# Patient Record
Sex: Male | Born: 1955 | Race: White | Hispanic: No | Smoking: Current every day smoker
Health system: Southern US, Community
[De-identification: ages and names within clinical notes are randomized; demographics above are authoritative.]

## PROBLEM LIST (undated history)

## (undated) DIAGNOSIS — K649 Unspecified hemorrhoids: Secondary | ICD-10-CM

## (undated) DIAGNOSIS — K635 Polyp of colon: Secondary | ICD-10-CM

## (undated) DIAGNOSIS — I209 Angina pectoris, unspecified: Secondary | ICD-10-CM

## (undated) DIAGNOSIS — L570 Actinic keratosis: Secondary | ICD-10-CM

## (undated) DIAGNOSIS — Z720 Tobacco use: Secondary | ICD-10-CM

## (undated) DIAGNOSIS — F32A Depression, unspecified: Secondary | ICD-10-CM

## (undated) DIAGNOSIS — I10 Essential (primary) hypertension: Secondary | ICD-10-CM

## (undated) DIAGNOSIS — F329 Major depressive disorder, single episode, unspecified: Secondary | ICD-10-CM

## (undated) DIAGNOSIS — E7800 Pure hypercholesterolemia, unspecified: Secondary | ICD-10-CM

## (undated) DIAGNOSIS — C801 Malignant (primary) neoplasm, unspecified: Secondary | ICD-10-CM

## (undated) DIAGNOSIS — F419 Anxiety disorder, unspecified: Secondary | ICD-10-CM

## (undated) HISTORY — PX: ANKLE ARTHROSCOPY: SUR85

## (undated) HISTORY — DX: Actinic keratosis: L57.0

## (undated) HISTORY — PX: OTHER SURGICAL HISTORY: SHX169

## (undated) HISTORY — PX: PENILE PROSTHESIS IMPLANT: SHX240

## (undated) HISTORY — PX: COLONOSCOPY: SHX174

---

## 2007-03-14 ENCOUNTER — Ambulatory Visit: Payer: Self-pay | Admitting: Urology

## 2007-11-02 DIAGNOSIS — C4491 Basal cell carcinoma of skin, unspecified: Secondary | ICD-10-CM

## 2007-11-02 HISTORY — DX: Basal cell carcinoma of skin, unspecified: C44.91

## 2008-05-30 DIAGNOSIS — C439 Malignant melanoma of skin, unspecified: Secondary | ICD-10-CM

## 2008-05-30 HISTORY — DX: Malignant melanoma of skin, unspecified: C43.9

## 2009-09-17 DIAGNOSIS — C4492 Squamous cell carcinoma of skin, unspecified: Secondary | ICD-10-CM

## 2009-09-17 DIAGNOSIS — D229 Melanocytic nevi, unspecified: Secondary | ICD-10-CM

## 2009-09-17 HISTORY — DX: Squamous cell carcinoma of skin, unspecified: C44.92

## 2009-09-17 HISTORY — DX: Melanocytic nevi, unspecified: D22.9

## 2011-10-22 ENCOUNTER — Ambulatory Visit: Payer: Self-pay | Admitting: Unknown Physician Specialty

## 2013-01-26 ENCOUNTER — Emergency Department: Payer: Self-pay | Admitting: Internal Medicine

## 2013-02-20 ENCOUNTER — Ambulatory Visit: Payer: Self-pay | Admitting: Specialist

## 2014-01-10 ENCOUNTER — Ambulatory Visit: Payer: Self-pay | Admitting: Internal Medicine

## 2014-01-11 ENCOUNTER — Ambulatory Visit: Payer: Self-pay | Admitting: Internal Medicine

## 2014-02-12 IMAGING — CR DG ANKLE COMPLETE 3+V*L*
1 series · 6 of 6 positions shown · non-contrast
Comparison: none

REASON FOR EXAM: Left ankle pain from fracture
COMMENTS:

[Series 1: x ankle obl left · 0.14mm/px · 6 of 6 slices shown]
[im 1/6]
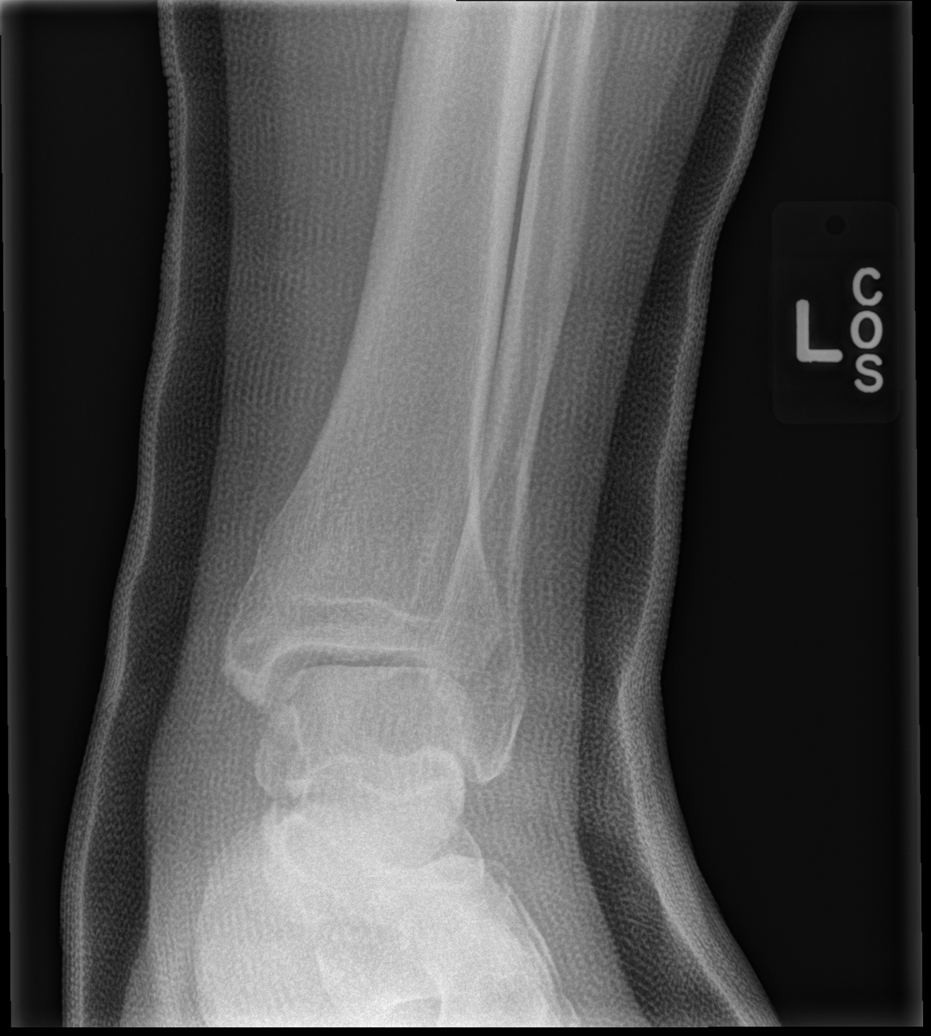
[im 2/6]
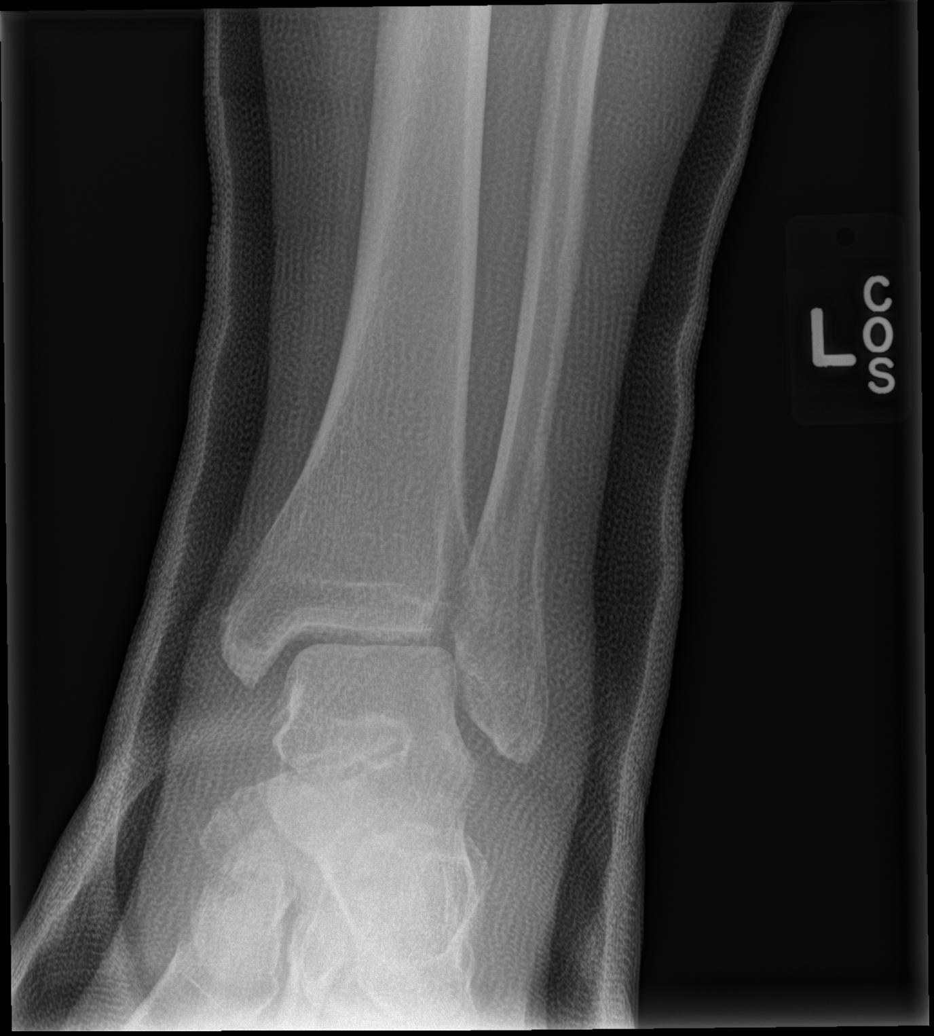
[im 3/6]
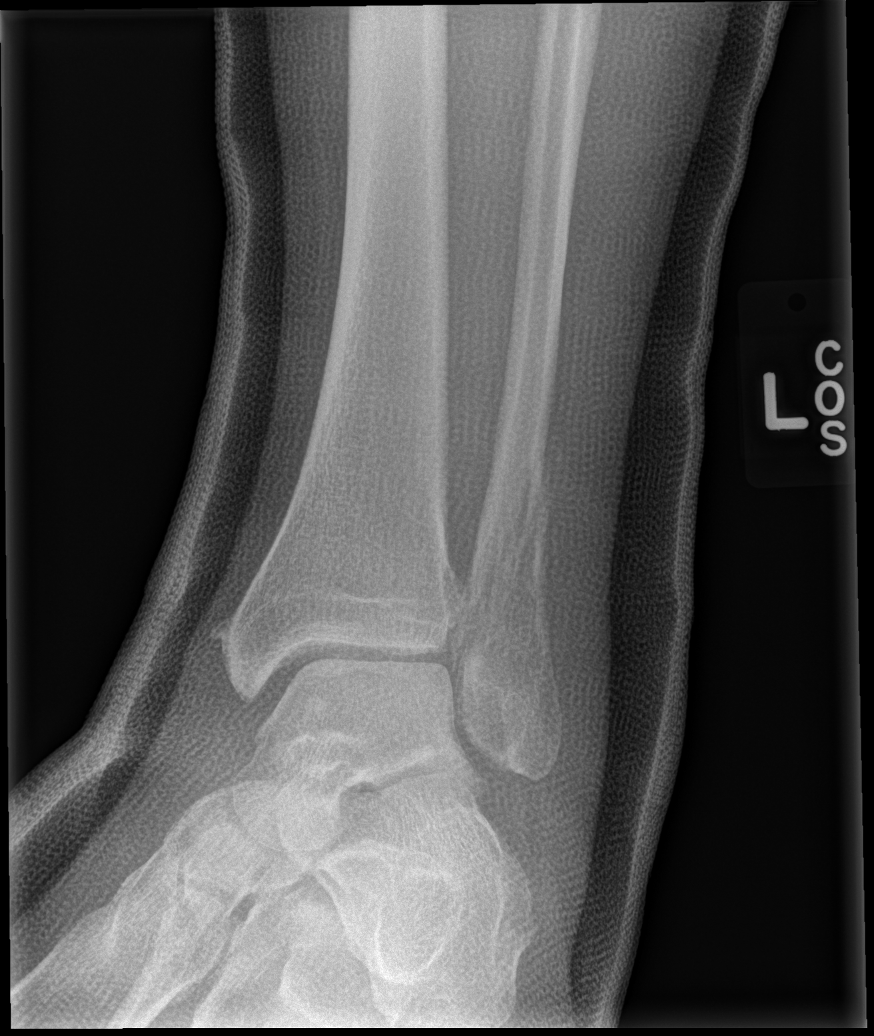
[im 4/6]
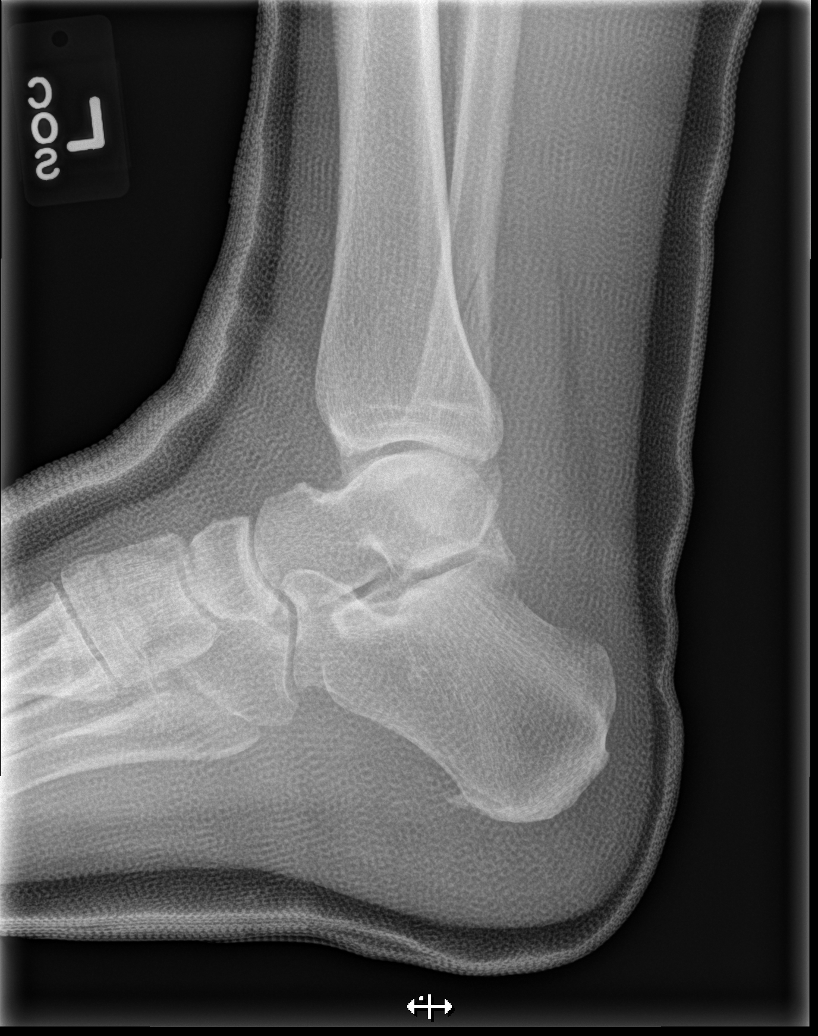
[im 5/6]
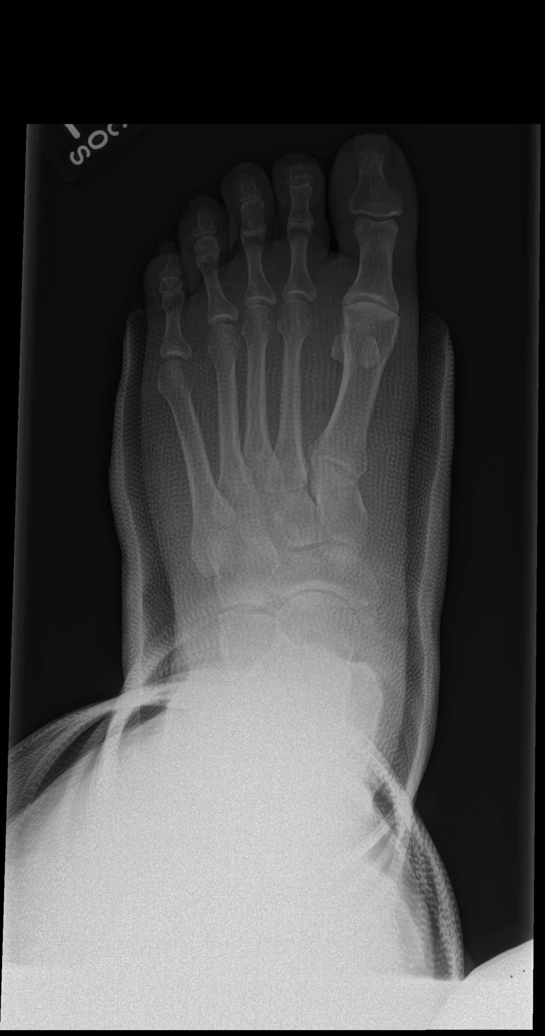
[im 6/6]
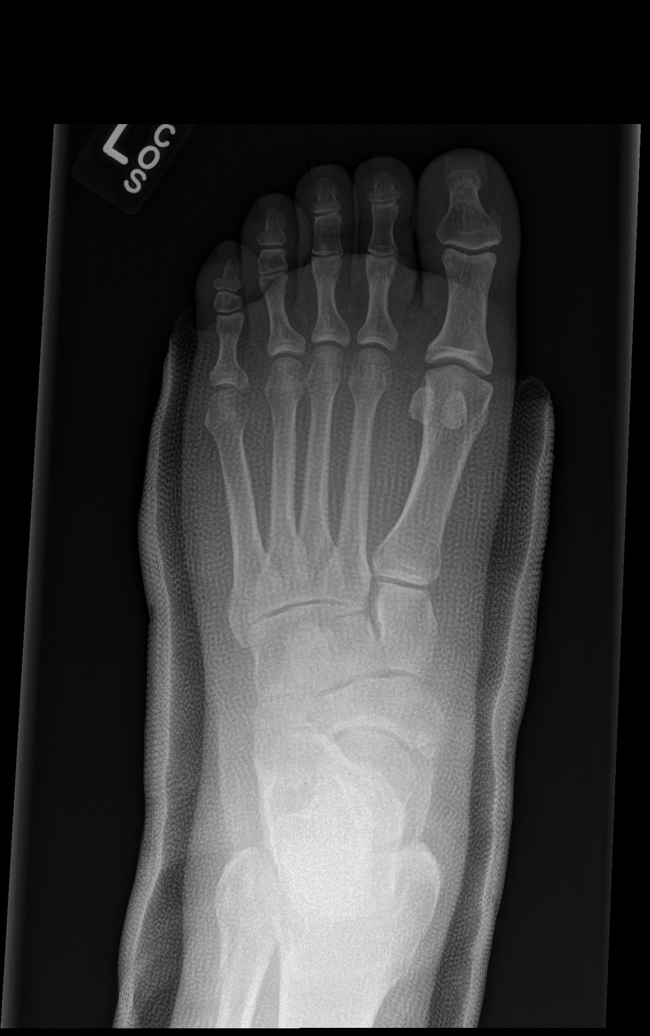

[6 of 6 positions shown; findings below may reference images not displayed]

PROCEDURE:     DXR - DXR ANKLE LEFT COMPLETE  - February 20, 2013  [DATE]

RESULT:     In plaster views of the left ankle reveal a spiral fracture of
the distal fibular metadiaphysis. Alignment is near-anatomic. There is very
mild widening of the joint mortise. There is no posterior or medial
malleolar fracture. The talar dome is grossly normal. There is a plantar
calcaneal spur.
IMPRESSION: There is evidence of ongoing healing of the distal fibular
fracture is a small amount of periosteal reaction demonstrated. The fracture
lines remain visible.

[REDACTED]

## 2015-05-02 ENCOUNTER — Emergency Department: Payer: No Typology Code available for payment source

## 2015-05-02 ENCOUNTER — Emergency Department
Admission: EM | Admit: 2015-05-02 | Discharge: 2015-05-02 | Disposition: A | Discharge status: Home / Self Care | Payer: No Typology Code available for payment source | Attending: Emergency Medicine | Admitting: Emergency Medicine

## 2015-05-02 ENCOUNTER — Encounter: Payer: Self-pay | Admitting: Emergency Medicine

## 2015-05-02 DIAGNOSIS — R0602 Shortness of breath: Secondary | ICD-10-CM | POA: Diagnosis not present

## 2015-05-02 DIAGNOSIS — R079 Chest pain, unspecified: Secondary | ICD-10-CM | POA: Diagnosis present

## 2015-05-02 DIAGNOSIS — I10 Essential (primary) hypertension: Secondary | ICD-10-CM | POA: Insufficient documentation

## 2015-05-02 HISTORY — DX: Anxiety disorder, unspecified: F41.9

## 2015-05-02 HISTORY — DX: Essential (primary) hypertension: I10

## 2015-05-02 HISTORY — DX: Pure hypercholesterolemia, unspecified: E78.00

## 2015-05-02 HISTORY — DX: Major depressive disorder, single episode, unspecified: F32.9

## 2015-05-02 HISTORY — DX: Depression, unspecified: F32.A

## 2015-05-02 LAB — CBC
HCT: 50 % (ref 40.0–52.0)
Hemoglobin: 17.2 g/dL (ref 13.0–18.0)
MCH: 32.4 pg (ref 26.0–34.0)
MCHC: 34.4 g/dL (ref 32.0–36.0)
MCV: 93.9 fL (ref 80.0–100.0)
Platelets: 316 10*3/uL (ref 150–440)
RBC: 5.32 MIL/uL (ref 4.40–5.90)
RDW: 13.7 % (ref 11.5–14.5)
WBC: 6.8 10*3/uL (ref 3.8–10.6)

## 2015-05-02 LAB — BASIC METABOLIC PANEL WITH GFR
Anion gap: 8 (ref 5–15)
BUN: 25 mg/dL — ABNORMAL HIGH (ref 6–20)
CO2: 27 mmol/L (ref 22–32)
Calcium: 9.4 mg/dL (ref 8.9–10.3)
Chloride: 101 mmol/L (ref 101–111)
Creatinine, Ser: 1.11 mg/dL (ref 0.61–1.24)
GFR calc Af Amer: >60 mL/min
GFR calc non Af Amer: >60 mL/min
Glucose, Bld: 107 mg/dL — ABNORMAL HIGH (ref 65–99)
Potassium: 3.9 mmol/L (ref 3.5–5.1)
Sodium: 136 mmol/L (ref 135–145)

## 2015-05-02 LAB — TROPONIN I: Troponin I: <0.03 ng/mL (ref <0.031)

## 2015-05-02 NOTE — ED Provider Notes (Signed)
[facility] Emergency Department Provider Note  ____________________________________________  Time seen: Approximately 12:15 PM  I have reviewed the triage vital signs and the nursing notes.   HISTORY  Chief Complaint Chest Pain    HPI Chad Terry is a 59 y.o. male with a history of hypertension and hypercholesterolemia who is presenting today with chest pain over the past week. He says that it started this past Saturday with a shooting pain that lasted a second in his left chest down his left arm. He said that afterwards for several seconds he also feels a soreness in his left chest that worsens when he twists to his right. He denies any shortness of breath that is associated with it as well as any nausea vomiting or diaphoresis. He says that he has had shortness of breath in the evenings for the past 2-1/2 years since the death of his son. He says that he takes antidepressants as well as anxiety medicines for this in the evening which relieves his shortness of breath. He has no history of cardiac disease however his mother died from heart disease in her 20s. He does smoke cigarettes. Eyes no chest pain right now. Says that he has also had 6-8 episodes of the shooting pain per day since this past Saturday.No exertional symptoms.   Past Medical History  Diagnosis Date  . Hypertension   . Hypercholesteremia   . Anxiety   . Depression     There are no active problems to display for this patient.   Past Surgical History  Procedure Laterality Date  . Penile prosthesis implant      No current outpatient prescriptions on file.  Allergies Review of patient's allergies indicates no known allergies.  No family history on file.  Social History Social History  Substance Use Topics  . Smoking status: Not on file  . Smokeless tobacco: Not on file  . Alcohol Use: Not on file    Review of Systems Constitutional: No fever/chills Eyes: No visual  changes. ENT: No sore throat. Cardiovascular: As above  Respiratory: As above  Gastrointestinal: No abdominal pain.  No nausea, no vomiting.  No diarrhea.  No constipation. Genitourinary: Negative for dysuria. Musculoskeletal: Negative for back pain. Skin: Negative for rash. Neurological: Negative for headaches, focal weakness or numbness.  10-point ROS otherwise negative.  ____________________________________________   PHYSICAL EXAM:  VITAL SIGNS: ED Triage Vitals  Enc Vitals Group     BP 05/02/15 0923 156/93 mmHg     Pulse Rate 05/02/15 0923 89     Resp 05/02/15 0923 18     Temp 05/02/15 0923 98.2 F (36.8 C)     Temp Source 05/02/15 0923 Oral     SpO2 05/02/15 0923 95 %     Weight 05/02/15 0923 215 lb (97.523 kg)     Height 05/02/15 0923 5\' 9"  (1.753 m)     Head Cir --      Peak Flow --      Pain Score 05/02/15 0923 0     Pain Loc --      Pain Edu? --      Excl. in [place]? --     Constitutional: Alert and oriented. Well appearing and in no acute distress. Eyes: Conjunctivae are normal. PERRL. EOMI. Head: Atraumatic. Nose: No congestion/rhinnorhea. Mouth/Throat: Mucous membranes are moist.  Oropharynx non-erythematous. Neck: No stridor.   Cardiovascular: Normal rate, regular rhythm. Grossly normal heart sounds.  Good peripheral circulation. No reproducibility with palpation to left  chest. Bilateral and equal radial pulses present. Respiratory: Normal respiratory effort.  No retractions. Lungs CTAB. Gastrointestinal: Soft and nontender. No distention. No abdominal bruits. No CVA tenderness. Musculoskeletal: No lower extremity tenderness nor edema.  No joint effusions. Neurologic:  Normal speech and language. No gross focal neurologic deficits are appreciated. No gait instability. Skin:  Skin is warm, dry and intact. No rash noted. Psychiatric: Mood and affect are normal. Speech and behavior are normal.  ____________________________________________   LABS (all labs  ordered are listed, but only abnormal results are displayed)  Labs Reviewed  BASIC METABOLIC PANEL - Abnormal; Notable for the following:    Glucose, Bld 107 (*)    BUN 25 (*)    All other components within normal limits  CBC  TROPONIN I   ____________________________________________  EKG  ED ECG REPORT I, Doran Stabler, the attending physician, personally viewed and interpreted this ECG.   Date: 05/02/2015  EKG Time: 936  Rate: 86  Rhythm: normal sinus rhythm  Axis: Normal axis  Intervals:none  ST&T Change: Nonspecific ST and T abnormality but with poor baseline. We will repeat EKG.  ED ECG REPORT I, Doran Stabler, the attending physician, personally viewed and interpreted this ECG.   Date: 05/02/2015  EKG Time: 1238  Rate: 63  Rhythm: normal sinus rhythm  Axis: Normal axis  Intervals:none  ST&T Change: Biphasic T waves in V2 and V3. No ST elevations or depressions.   ____________________________________________  RADIOLOGY  No active cardiopulmonary disease on the chest x-ray. ____________________________________________   PROCEDURES    ____________________________________________   INITIAL IMPRESSION / ASSESSMENT AND PLAN / ED COURSE  Pertinent labs & imaging results that were available during my care of the patient were reviewed by me and considered in my medical decision making (see chart for details).  Patient with pain which is atypical for cardiac chest pain. Shortness of breath that she has been ongoing over the past 2-1/2 years. Discussed the case with Dr. Saralyn Pilar of cardiology who agrees to see the patient in the office. Because the symptoms have been ongoing for a week and the patient has only nonspecific T-wave abnormality and his EKG with a normal troponin and no chest pain at this time I believe he will be safe for follow-up in the cardiology office. Dr. Saralyn Pilar recommends that the patient call the office to schedule urgent  follow-up care. A splint is to the patient who understands and is went to comply with this plan. Will be discharged home. Patient takes a baby aspirin at home and will continue to do so.  ____________________________________________   FINAL CLINICAL IMPRESSION(S) / ED DIAGNOSES  Chest pain.    Orbie Pyo, MD 05/02/15 [phone]

## 2015-05-02 NOTE — ED Notes (Addendum)
Pt c/o pain to left chest, intermittently; pt can point to one particular spot above left nipple where the pain occurs-6-8 times a day; pt says this past weekend he was laying in bed when he had a sharp pain go down his left arm and left leg that made both jump; pt currently denies any pain; pt says only short of breath with pain at night when going to bed; denies N/V; denies diaphoresis; pt admits he's here "preventatively"; [place] and he'd like to get checked out before going out of town

## 2015-05-02 NOTE — ED Notes (Signed)
Nurse Dorthula Rue called lab to discuss why Troponin had not been resulted, lab states they are running it and it will result soon. Patricie Geeslin, first nurse notified, Marya Amsler, charge nurse notified.

## 2015-05-02 NOTE — ED Notes (Signed)
Patient standing walking around bed and hall.

## 2015-05-02 NOTE — ED Notes (Signed)
Pt very inappropriate in triage with staff; talking about his penile pump and cursing; says he doesn't drink or smoke, this is just her normal personality

## 2015-05-02 NOTE — ED Notes (Signed)
Pt states pain started last Saturday that was pinpoint sharp pain over left breast with tenderness.  States on Saturday pain quickly radiated to left arm and made it jump, a second time went to left leg and made it jump.  Patient reports having 6-8 episodes of quick sharp pain that dissipates right away with no other symptoms.  Denies n/v/d, denies other symptoms.  Pt is A&Ox4, speaking in complete and coherent sentences and in NAD at this time.

## 2015-05-23 ENCOUNTER — Ambulatory Visit
Admission: RE | Admit: 2015-05-23 | Discharge: 2015-05-24 | Disposition: A | Discharge status: Home / Self Care | Payer: No Typology Code available for payment source | Source: Ambulatory Visit | Attending: Cardiology | Admitting: Cardiology

## 2015-05-23 ENCOUNTER — Encounter
Admission: RE | Disposition: A | Discharge status: Home / Self Care | Payer: Self-pay | Source: Ambulatory Visit | Attending: Cardiology

## 2015-05-23 ENCOUNTER — Encounter: Payer: Self-pay | Admitting: *Deleted

## 2015-05-23 DIAGNOSIS — F329 Major depressive disorder, single episode, unspecified: Secondary | ICD-10-CM | POA: Insufficient documentation

## 2015-05-23 DIAGNOSIS — Z8249 Family history of ischemic heart disease and other diseases of the circulatory system: Secondary | ICD-10-CM | POA: Diagnosis not present

## 2015-05-23 DIAGNOSIS — I2510 Atherosclerotic heart disease of native coronary artery without angina pectoris: Secondary | ICD-10-CM | POA: Diagnosis not present

## 2015-05-23 DIAGNOSIS — Z79899 Other long term (current) drug therapy: Secondary | ICD-10-CM | POA: Insufficient documentation

## 2015-05-23 DIAGNOSIS — I259 Chronic ischemic heart disease, unspecified: Secondary | ICD-10-CM | POA: Insufficient documentation

## 2015-05-23 DIAGNOSIS — F17200 Nicotine dependence, unspecified, uncomplicated: Secondary | ICD-10-CM | POA: Diagnosis not present

## 2015-05-23 DIAGNOSIS — F419 Anxiety disorder, unspecified: Secondary | ICD-10-CM | POA: Diagnosis not present

## 2015-05-23 DIAGNOSIS — Z7982 Long term (current) use of aspirin: Secondary | ICD-10-CM | POA: Insufficient documentation

## 2015-05-23 DIAGNOSIS — I10 Essential (primary) hypertension: Secondary | ICD-10-CM | POA: Insufficient documentation

## 2015-05-23 DIAGNOSIS — Z86010 Personal history of colonic polyps: Secondary | ICD-10-CM | POA: Insufficient documentation

## 2015-05-23 DIAGNOSIS — Z9889 Other specified postprocedural states: Secondary | ICD-10-CM | POA: Insufficient documentation

## 2015-05-23 DIAGNOSIS — E785 Hyperlipidemia, unspecified: Secondary | ICD-10-CM | POA: Diagnosis not present

## 2015-05-23 DIAGNOSIS — J449 Chronic obstructive pulmonary disease, unspecified: Secondary | ICD-10-CM | POA: Diagnosis not present

## 2015-05-23 DIAGNOSIS — I25110 Atherosclerotic heart disease of native coronary artery with unstable angina pectoris: Secondary | ICD-10-CM | POA: Diagnosis present

## 2015-05-23 DIAGNOSIS — Z8042 Family history of malignant neoplasm of prostate: Secondary | ICD-10-CM | POA: Diagnosis not present

## 2015-05-23 DIAGNOSIS — R079 Chest pain, unspecified: Secondary | ICD-10-CM | POA: Diagnosis present

## 2015-05-23 HISTORY — PX: CARDIAC CATHETERIZATION: SHX172

## 2015-05-23 HISTORY — DX: Polyp of colon: K63.5

## 2015-05-23 SURGERY — LEFT HEART CATH AND CORONARY ANGIOGRAPHY
Anesthesia: Moderate Sedation

## 2015-05-23 MED ORDER — BIVALIRUDIN BOLUS VIA INFUSION - CUPID
INTRAVENOUS | Status: DC | PRN
  Administered 2015-05-23: 72.15 mg via INTRAVENOUS

## 2015-05-23 MED ORDER — ALPRAZOLAM ER 1 MG PO TB24
2.0000 mg | ORAL_TABLET | Freq: Three times a day (TID) | ORAL | Status: DC
  Administered 2015-05-23: 2 mg via ORAL
  Filled 2015-05-23 (×2): qty 2

## 2015-05-23 MED ORDER — TRIAMTERENE-HCTZ 37.5-25 MG PO TABS
1.0000 | ORAL_TABLET | Freq: Every day | ORAL | Status: DC
  Filled 2015-05-23 (×2): qty 1

## 2015-05-23 MED ORDER — BIVALIRUDIN 250 MG IV SOLR
INTRAVENOUS | Status: AC
  Filled 2015-05-23: qty 250

## 2015-05-23 MED ORDER — FENTANYL CITRATE (PF) 100 MCG/2ML IJ SOLN
INTRAMUSCULAR | Status: AC
  Filled 2015-05-23: qty 2

## 2015-05-23 MED ORDER — CLOPIDOGREL BISULFATE 75 MG PO TABS
ORAL_TABLET | ORAL | Status: AC
  Filled 2015-05-23: qty 8

## 2015-05-23 MED ORDER — SODIUM CHLORIDE 0.9 % IJ SOLN
3.0000 mL | Freq: Two times a day (BID) | INTRAMUSCULAR | Status: DC
  Administered 2015-05-23: 3 mL via INTRAVENOUS

## 2015-05-23 MED ORDER — NITROGLYCERIN 5 MG/ML IV SOLN
INTRAVENOUS | Status: AC
  Filled 2015-05-23: qty 10

## 2015-05-23 MED ORDER — ASPIRIN 81 MG PO CHEW
CHEWABLE_TABLET | ORAL | Status: DC | PRN
  Administered 2015-05-23: 243 mg via ORAL

## 2015-05-23 MED ORDER — NITROGLYCERIN 1 MG/10 ML FOR IR/CATH LAB
INTRA_ARTERIAL | Status: DC | PRN
  Administered 2015-05-23 (×6): 200 ug via INTRACORONARY

## 2015-05-23 MED ORDER — ACETAMINOPHEN 325 MG PO TABS: 650.0000 mg | ORAL_TABLET | ORAL | Status: DC | PRN

## 2015-05-23 MED ORDER — ASPIRIN 81 MG PO CHEW
CHEWABLE_TABLET | ORAL | Status: AC
  Filled 2015-05-23: qty 3

## 2015-05-23 MED ORDER — SODIUM CHLORIDE 0.9 % IV SOLN
250.0000 mg | INTRAVENOUS | Status: DC | PRN
  Administered 2015-05-23: 1.75 mg/kg/h via INTRAVENOUS

## 2015-05-23 MED ORDER — SIMVASTATIN 40 MG PO TABS: 40.0000 mg | ORAL_TABLET | Freq: Every day | ORAL | Status: DC

## 2015-05-23 MED ORDER — METOPROLOL SUCCINATE ER 50 MG PO TB24: 50.0000 mg | ORAL_TABLET | Freq: Every day | ORAL | Status: DC

## 2015-05-23 MED ORDER — DIMENHYDRINATE 50 MG PO TABS
200.0000 mg | ORAL_TABLET | Freq: Every evening | ORAL | Status: DC | PRN
  Administered 2015-05-23: 200 mg via ORAL
  Filled 2015-05-23 (×3): qty 4

## 2015-05-23 MED ORDER — ONDANSETRON HCL 4 MG/2ML IJ SOLN: 4.0000 mg | Freq: Four times a day (QID) | INTRAMUSCULAR | Status: DC | PRN

## 2015-05-23 MED ORDER — FENTANYL CITRATE (PF) 100 MCG/2ML IJ SOLN
INTRAMUSCULAR | Status: DC | PRN
  Administered 2015-05-23: 50 ug via INTRAVENOUS

## 2015-05-23 MED ORDER — CLOPIDOGREL BISULFATE 75 MG PO TABS
ORAL_TABLET | ORAL | Status: DC | PRN
  Administered 2015-05-23: 600 mg via ORAL

## 2015-05-23 MED ORDER — MIDAZOLAM HCL 2 MG/2ML IJ SOLN
INTRAMUSCULAR | Status: DC | PRN
  Administered 2015-05-23 (×2): 1 mg via INTRAVENOUS

## 2015-05-23 MED ORDER — MIDAZOLAM HCL 2 MG/2ML IJ SOLN
INTRAMUSCULAR | Status: AC
  Filled 2015-05-23: qty 2

## 2015-05-23 MED ORDER — SERTRALINE HCL 50 MG PO TABS
150.0000 mg | ORAL_TABLET | Freq: Every day | ORAL | Status: DC
  Administered 2015-05-23: 150 mg via ORAL
  Filled 2015-05-23 (×3): qty 1

## 2015-05-23 MED ORDER — CLOPIDOGREL BISULFATE 75 MG PO TABS
75.0000 mg | ORAL_TABLET | Freq: Every day | ORAL | Status: DC
  Administered 2015-05-24: 75 mg via ORAL
  Filled 2015-05-23: qty 1

## 2015-05-23 MED ORDER — IOHEXOL 300 MG/ML  SOLN
INTRAMUSCULAR | Status: DC | PRN
  Administered 2015-05-23: 375 mL via INTRA_ARTERIAL

## 2015-05-23 MED ORDER — ASPIRIN 81 MG PO CHEW: 81.0000 mg | CHEWABLE_TABLET | Freq: Every day | ORAL | Status: DC

## 2015-05-23 MED ORDER — SODIUM CHLORIDE 0.9 % IV SOLN
INTRAVENOUS | Status: DC
  Administered 2015-05-23: 12:00:00 via INTRAVENOUS

## 2015-05-23 SURGICAL SUPPLY — 20 items
BALLN TREK RX 2.25X20 (BALLOONS) ×2
BALLN TREK RX 2.5X15 (BALLOONS) ×2
BALLOON TREK RX 2.25X20 (BALLOONS) ×1 IMPLANT
BALLOON TREK RX 2.5X15 (BALLOONS) ×1 IMPLANT
CATH INFINITI 5FR ANG PIGTAIL (CATHETERS) ×2 IMPLANT
CATH INFINITI 5FR JL4 (CATHETERS) ×2 IMPLANT
CATH INFINITI JR4 5F (CATHETERS) ×2 IMPLANT
CATH VISTA GUIDE 6FR JR4 SH (CATHETERS) ×2 IMPLANT
CATH VISTA GUIDE 6FR XB3.5 (CATHETERS) ×2 IMPLANT
DEVICE CLOSURE MYNXGRIP 6/7F (Vascular Products) ×2 IMPLANT
DEVICE INFLAT 30 PLUS (MISCELLANEOUS) ×2 IMPLANT
KIT MANI 3VAL PERCEP (MISCELLANEOUS) ×2 IMPLANT
NEEDLE PERC 18GX7CM (NEEDLE) ×2 IMPLANT
PACK CARDIAC CATH (CUSTOM PROCEDURE TRAY) ×2 IMPLANT
SHEATH AVANTI 5FR X 11CM (SHEATH) ×2 IMPLANT
SHEATH PINNACLE 6F 10CM (SHEATH) ×2 IMPLANT
STENT XIENCE ALPINE RX 2.25X23 (Permanent Stent) ×2 IMPLANT
STENT XIENCE ALPINE RX 2.75X15 (Permanent Stent) ×2 IMPLANT
WIRE ASAHI PROWATER 180CM (WIRE) ×2 IMPLANT
WIRE EMERALD 3MM-J .035X150CM (WIRE) ×2 IMPLANT

## 2015-05-23 NOTE — Progress Notes (Signed)
Stat ekg ordered, cardiopulmonary/Stacy to do ekg as pt in 249

## 2015-05-23 NOTE — Progress Notes (Signed)
Pt doing well post pci procedure per Dr Josefa Half, with vss. No bleeding nor hematoma at right groin site. Eating supper, taking pos; without difficulty, angiomax gtt finished teaching done and stent card given to sister, with questions answered, pt already has return appt pre procedure, told to keep per Dr Josefa Half. Report called to Tammy RN on telemetry. sr per monitor with stable bp/map. Offers no complaints at this time.

## 2015-05-23 NOTE — Progress Notes (Signed)
Pt wants dimenhydrinate for hour of sleep and Pt also needs a diet order. MD notified. Orders received. Will continue to assess.

## 2015-05-24 ENCOUNTER — Encounter: Payer: Self-pay | Admitting: Cardiology

## 2015-05-24 DIAGNOSIS — I2510 Atherosclerotic heart disease of native coronary artery without angina pectoris: Secondary | ICD-10-CM | POA: Diagnosis not present

## 2015-05-24 LAB — CBC
HEMATOCRIT: 43.4 % (ref 40.0–52.0)
Hemoglobin: 14.9 g/dL (ref 13.0–18.0)
MCH: 32.3 pg (ref 26.0–34.0)
MCHC: 34.4 g/dL (ref 32.0–36.0)
MCV: 93.7 fL (ref 80.0–100.0)
PLATELETS: 253 10*3/uL (ref 150–440)
RBC: 4.63 MIL/uL (ref 4.40–5.90)
RDW: 13.9 % (ref 11.5–14.5)
WBC: 7.4 10*3/uL (ref 3.8–10.6)

## 2015-05-24 LAB — BASIC METABOLIC PANEL
Anion gap: 7 (ref 5–15)
BUN: 18 mg/dL (ref 6–20)
CO2: 27 mmol/L (ref 22–32)
CREATININE: 0.98 mg/dL (ref 0.61–1.24)
Calcium: 9.1 mg/dL (ref 8.9–10.3)
Chloride: 106 mmol/L (ref 101–111)
GFR calc Af Amer: >60 mL/min (ref >60)
GLUCOSE: 100 mg/dL — AB (ref 65–99)
POTASSIUM: 3.8 mmol/L (ref 3.5–5.1)
SODIUM: 140 mmol/L (ref 135–145)

## 2015-05-24 MED ORDER — CLOPIDOGREL BISULFATE 75 MG PO TABS: 75.0000 mg | ORAL_TABLET | Freq: Every day | ORAL | Status: DC

## 2015-05-24 NOTE — Progress Notes (Signed)
EKG done

## 2015-05-24 NOTE — Discharge Summary (Signed)
Physician Discharge Summary  Patient ID: Chad Terry MRN: [id number] DOB/AGE: Feb 02, 1956 59 y.o.  Admit date: 05/23/2015 Discharge date: 05/24/2015  Primary Discharge Diagnosis unstable angina Secondary Discharge Diagnosis coronary artery disease  Significant Diagnostic Studies: Cardiac catheterization  Consults: None  Hospital Course: The patient underwent elective cardiac catheterization on 05/23/15 which revealed two-vessel coronary artery disease with high-grade 95% stenosis mid LAD and distal RCA. The patient underwent PCI receiving 0.75 x 15 mm XienceAlpine stent in the mid LAD, and 2.25 x 23 mm Xience Alpine stent in the distal RCA with an excellent angiographic result. Patient had an uncomplicated hospital course. On the morning of 05/24/15, the patient was ambulating without difficulty was discharged home. He is scheduled to see me in follow-up in one week   Discharge Exam: Blood pressure 131/79, pulse 65, temperature 98.6 F (37 C), temperature source Oral, resp. rate 16, height 5\' 9"  (1.753 m), weight 96.163 kg (212 lb), SpO2 96 %.  General appearance: alert Labs:   Lab Results  Component Value Date   WBC 7.4 05/24/2015   HGB 14.9 05/24/2015   HCT 43.4 05/24/2015   MCV 93.7 05/24/2015   PLT 253 05/24/2015    Recent Labs Lab 05/24/15 0431  NA 140  K 3.8  CL 106  CO2 27  BUN 18  CREATININE 0.98  CALCIUM 9.1  GLUCOSE 100*      Radiology:  EKG: Normal sinus rhythm  FOLLOW UP PLANS AND APPOINTMENTS    Medication List    TAKE these medications        ALPRAZolam 2 MG 24 hr tablet  Commonly known as:  XANAX XR  Take 2 mg by mouth 3 (three) times daily. Takes at bedtime one pill, other only if needed     aspirin 81 MG tablet  Take 81 mg by mouth daily.     clopidogrel 75 MG tablet  Commonly known as:  PLAVIX  Take 1 tablet (75 mg total) by mouth daily with breakfast.     dimenhyDRINATE 50 MG tablet  Commonly known as:  DRAMAMINE  Take  50 mg by mouth daily at 10 pm. 4 tablet at bedtime     metoprolol succinate 50 MG 24 hr tablet  Commonly known as:  TOPROL-XL  Take 50 mg by mouth daily. Take with or immediately following a meal.     multivitamin capsule  Take 1 capsule by mouth daily.     OMEGA-3 FISH OIL PO  Take 360-1,200 mg by mouth every morning. Omega 3-fish oil 360-1200mg  daily not in database     Potassium Gluconate 550 (90 K) MG Tabs  Take 550 mg by mouth every morning. Written as 550mg (90mg )?tab     sertraline 100 MG tablet  Commonly known as:  ZOLOFT  Take 150 mg by mouth at bedtime.     simvastatin 40 MG tablet  Commonly known as:  ZOCOR  Take 40 mg by mouth daily.     triamterene-hydrochlorothiazide 37.5-25 MG capsule  Commonly known as:  DYAZIDE  Take 1 capsule by mouth daily.           Follow-up Information    Follow up with Lexis Potenza, MD In 1 week.   Specialty:  Cardiology   Contact information:   [facility] [place] [address] [phone]       BRING ALL MEDICATIONS WITH YOU TO FOLLOW UP APPOINTMENTS  Time spent with patient to include physician time: 25 min Signed:  Neriyah Cercone MD, PhD, [facility] 05/24/2015, 8:01 AM

## 2017-01-18 ENCOUNTER — Encounter: Payer: Self-pay | Admitting: Anesthesiology

## 2017-01-18 ENCOUNTER — Ambulatory Visit
Admission: RE | Admit: 2017-01-18 | Discharge: 2017-01-18 | Disposition: A | Discharge status: Home / Self Care | Payer: BLUE CROSS/BLUE SHIELD | Source: Ambulatory Visit | Attending: Unknown Physician Specialty | Admitting: Unknown Physician Specialty

## 2017-01-18 ENCOUNTER — Ambulatory Visit: Payer: BLUE CROSS/BLUE SHIELD | Admitting: Anesthesiology

## 2017-01-18 ENCOUNTER — Encounter
Admission: RE | Disposition: A | Discharge status: Home / Self Care | Payer: Self-pay | Source: Ambulatory Visit | Attending: Unknown Physician Specialty

## 2017-01-18 DIAGNOSIS — Z539 Procedure and treatment not carried out, unspecified reason: Secondary | ICD-10-CM | POA: Diagnosis not present

## 2017-01-18 SURGERY — COLONOSCOPY WITH PROPOFOL
Anesthesia: General

## 2017-01-18 MED ORDER — PROPOFOL 500 MG/50ML IV EMUL
INTRAVENOUS | Status: AC
  Filled 2017-01-18: qty 50

## 2017-01-18 MED ORDER — SODIUM CHLORIDE 0.9 % IV SOLN: INTRAVENOUS | Status: DC

## 2017-01-18 MED ORDER — MIDAZOLAM HCL 2 MG/2ML IJ SOLN
INTRAMUSCULAR | Status: AC
  Filled 2017-01-18: qty 2

## 2017-01-18 MED ORDER — FENTANYL CITRATE (PF) 100 MCG/2ML IJ SOLN
INTRAMUSCULAR | Status: AC
  Filled 2017-01-18: qty 2

## 2017-01-18 MED ORDER — PIPERACILLIN-TAZOBACTAM 3.375 G IVPB 30 MIN
3.3750 g | Freq: Once | INTRAVENOUS | Status: DC
  Filled 2017-01-18: qty 50

## 2017-01-18 NOTE — Anesthesia Preprocedure Evaluation (Deleted)
Anesthesia Evaluation  Patient identified by MRN, date of birth, ID band Patient awake    Reviewed: Allergy & Precautions, NPO status , Patient's Chart, lab work & pertinent test results, reviewed documented beta blocker date and time   Airway Mallampati: II  TM Distance: >3 FB     Dental   Pulmonary Current Smoker,    Pulmonary exam normal        Cardiovascular hypertension, Pt. on medications + CAD  Normal cardiovascular exam     Neuro/Psych PSYCHIATRIC DISORDERS Anxiety Depression    GI/Hepatic negative GI ROS, Neg liver ROS,   Endo/Other  negative endocrine ROS  Renal/GU negative Renal ROS  negative genitourinary   Musculoskeletal negative musculoskeletal ROS (+)   Abdominal Normal abdominal exam  (+)   Peds negative pediatric ROS (+)  Hematology negative hematology ROS (+)   Anesthesia Other Findings Past Medical History: No date: Anxiety No date: Colon polyp No date: Depression No date: Hypercholesteremia No date: Hypertension  Hx of coronary stents  Reproductive/Obstetrics                            Anesthesia Physical Anesthesia Plan  ASA: III  Anesthesia Plan: General   Post-op Pain Management:    Induction: Intravenous  PONV Risk Score and Plan:   Airway Management Planned: Nasal Cannula  Additional Equipment:   Intra-op Plan:   Post-operative Plan:   Informed Consent: I have reviewed the patients History and Physical, chart, labs and discussed the procedure including the risks, benefits and alternatives for the proposed anesthesia with the patient or authorized representative who has indicated his/her understanding and acceptance.   Dental advisory given  Plan Discussed with: CRNA and Surgeon  Anesthesia Plan Comments:         Anesthesia Quick Evaluation

## 2017-04-05 ENCOUNTER — Ambulatory Visit: Payer: BLUE CROSS/BLUE SHIELD | Admitting: Anesthesiology

## 2017-04-05 ENCOUNTER — Ambulatory Visit
Admission: RE | Admit: 2017-04-05 | Discharge: 2017-04-05 | Disposition: A | Discharge status: Home / Self Care | Payer: BLUE CROSS/BLUE SHIELD | Source: Ambulatory Visit | Attending: Unknown Physician Specialty | Admitting: Unknown Physician Specialty

## 2017-04-05 ENCOUNTER — Encounter
Admission: RE | Disposition: A | Discharge status: Home / Self Care | Payer: Self-pay | Source: Ambulatory Visit | Attending: Unknown Physician Specialty

## 2017-04-05 DIAGNOSIS — F329 Major depressive disorder, single episode, unspecified: Secondary | ICD-10-CM | POA: Diagnosis not present

## 2017-04-05 DIAGNOSIS — Z7902 Long term (current) use of antithrombotics/antiplatelets: Secondary | ICD-10-CM | POA: Insufficient documentation

## 2017-04-05 DIAGNOSIS — F419 Anxiety disorder, unspecified: Secondary | ICD-10-CM | POA: Diagnosis not present

## 2017-04-05 DIAGNOSIS — Z955 Presence of coronary angioplasty implant and graft: Secondary | ICD-10-CM | POA: Insufficient documentation

## 2017-04-05 DIAGNOSIS — F17290 Nicotine dependence, other tobacco product, uncomplicated: Secondary | ICD-10-CM | POA: Diagnosis not present

## 2017-04-05 DIAGNOSIS — Z85820 Personal history of malignant melanoma of skin: Secondary | ICD-10-CM | POA: Insufficient documentation

## 2017-04-05 DIAGNOSIS — I2510 Atherosclerotic heart disease of native coronary artery without angina pectoris: Secondary | ICD-10-CM | POA: Insufficient documentation

## 2017-04-05 DIAGNOSIS — Z8249 Family history of ischemic heart disease and other diseases of the circulatory system: Secondary | ICD-10-CM | POA: Diagnosis not present

## 2017-04-05 DIAGNOSIS — Z8371 Family history of colonic polyps: Secondary | ICD-10-CM | POA: Insufficient documentation

## 2017-04-05 DIAGNOSIS — D127 Benign neoplasm of rectosigmoid junction: Secondary | ICD-10-CM | POA: Diagnosis not present

## 2017-04-05 DIAGNOSIS — Z7982 Long term (current) use of aspirin: Secondary | ICD-10-CM | POA: Diagnosis not present

## 2017-04-05 DIAGNOSIS — E7800 Pure hypercholesterolemia, unspecified: Secondary | ICD-10-CM | POA: Insufficient documentation

## 2017-04-05 DIAGNOSIS — Z1211 Encounter for screening for malignant neoplasm of colon: Secondary | ICD-10-CM | POA: Diagnosis present

## 2017-04-05 DIAGNOSIS — D125 Benign neoplasm of sigmoid colon: Secondary | ICD-10-CM | POA: Diagnosis not present

## 2017-04-05 DIAGNOSIS — I10 Essential (primary) hypertension: Secondary | ICD-10-CM | POA: Insufficient documentation

## 2017-04-05 DIAGNOSIS — Z79899 Other long term (current) drug therapy: Secondary | ICD-10-CM | POA: Insufficient documentation

## 2017-04-05 DIAGNOSIS — K635 Polyp of colon: Secondary | ICD-10-CM | POA: Insufficient documentation

## 2017-04-05 HISTORY — DX: Angina pectoris, unspecified: I20.9

## 2017-04-05 HISTORY — DX: Malignant (primary) neoplasm, unspecified: C80.1

## 2017-04-05 HISTORY — PX: COLONOSCOPY WITH PROPOFOL: SHX5780

## 2017-04-05 HISTORY — DX: Tobacco use: Z72.0

## 2017-04-05 HISTORY — DX: Unspecified hemorrhoids: K64.9

## 2017-04-05 SURGERY — COLONOSCOPY WITH PROPOFOL
Anesthesia: General

## 2017-04-05 MED ORDER — PROPOFOL 500 MG/50ML IV EMUL
INTRAVENOUS | Status: DC | PRN
  Administered 2017-04-05: 120 ug/kg/min via INTRAVENOUS

## 2017-04-05 MED ORDER — PROPOFOL 500 MG/50ML IV EMUL
INTRAVENOUS | Status: AC
  Filled 2017-04-05: qty 50

## 2017-04-05 MED ORDER — MIDAZOLAM HCL 2 MG/2ML IJ SOLN
INTRAMUSCULAR | Status: DC | PRN
  Administered 2017-04-05: 2 mg via INTRAVENOUS

## 2017-04-05 MED ORDER — PIPERACILLIN-TAZOBACTAM 3.375 G IVPB 30 MIN
3.3750 g | Freq: Once | INTRAVENOUS | Status: AC
  Administered 2017-04-05: 3.375 g via INTRAVENOUS
  Filled 2017-04-05: qty 50

## 2017-04-05 MED ORDER — EPHEDRINE SULFATE 50 MG/ML IJ SOLN
INTRAMUSCULAR | Status: AC
  Filled 2017-04-05: qty 1

## 2017-04-05 MED ORDER — MIDAZOLAM HCL 2 MG/2ML IJ SOLN
INTRAMUSCULAR | Status: AC
  Filled 2017-04-05: qty 2

## 2017-04-05 MED ORDER — FENTANYL CITRATE (PF) 100 MCG/2ML IJ SOLN
INTRAMUSCULAR | Status: DC | PRN
  Administered 2017-04-05 (×2): 50 ug via INTRAVENOUS

## 2017-04-05 MED ORDER — FENTANYL CITRATE (PF) 100 MCG/2ML IJ SOLN
INTRAMUSCULAR | Status: AC
  Filled 2017-04-05: qty 2

## 2017-04-05 MED ORDER — PIPERACILLIN-TAZOBACTAM 3.375 G IVPB
INTRAVENOUS | Status: AC
  Filled 2017-04-05: qty 50

## 2017-04-05 MED ORDER — EPHEDRINE SULFATE 50 MG/ML IJ SOLN
INTRAMUSCULAR | Status: DC | PRN
  Administered 2017-04-05 (×2): 5 mg via INTRAVENOUS

## 2017-04-05 MED ORDER — SODIUM CHLORIDE 0.9 % IV SOLN: INTRAVENOUS | Status: DC

## 2017-04-05 MED ORDER — SODIUM CHLORIDE 0.9 % IV SOLN
INTRAVENOUS | Status: DC
  Administered 2017-04-05: 11:00:00 via INTRAVENOUS

## 2017-04-05 NOTE — Anesthesia Procedure Notes (Signed)
Performed by: Vaughan Sine Pre-anesthesia Checklist: Patient identified, Emergency Drugs available, Suction available, Patient being monitored and Timeout performed Patient Re-evaluated:Patient Re-evaluated prior to induction Preoxygenation: Pre-oxygenation with 100% oxygen Induction Type: IV induction Placement Confirmation: CO2 detector and positive ETCO2

## 2017-04-05 NOTE — Transfer of Care (Signed)
Immediate Anesthesia Transfer of Care Note  Patient: Chad Terry  Procedure(s) Performed: COLONOSCOPY WITH PROPOFOL (N/A )  Patient Location: PACU  Anesthesia Type:General  Level of Consciousness: awake and sedated  Airway & Oxygen Therapy: Patient Spontanous Breathing and Patient connected to nasal cannula oxygen  Post-op Assessment: Report given to RN and Post -op Vital signs reviewed and stable  Post vital signs: Reviewed and stable  Last Vitals:  Vitals:   04/05/17 1056  BP: 114/78  Pulse: 73  Resp: 20  Temp: 36.9 C  SpO2: 97%    Last Pain:  Vitals:   04/05/17 1056  TempSrc: Tympanic         Complications: No apparent anesthesia complications

## 2017-04-05 NOTE — Anesthesia Post-op Follow-up Note (Signed)
Anesthesia QCDR form completed.        

## 2017-04-05 NOTE — Anesthesia Preprocedure Evaluation (Addendum)
Anesthesia Evaluation  Patient identified by MRN, date of birth, ID band Patient awake    Reviewed: Allergy & Precautions, NPO status , Patient's Chart, lab work & pertinent test results  History of Anesthesia Complications Negative for: history of anesthetic complications  Airway Mallampati: II  TM Distance: >3 FB Neck ROM: Full    Dental no notable dental hx.    Pulmonary neg COPD, Current Smoker,    breath sounds clear to auscultation- rhonchi (-) wheezing      Cardiovascular Exercise Tolerance: Good hypertension, + CAD and + Cardiac Stents (2016)  (-) CABG  Rhythm:Regular Rate:Normal - Systolic murmurs and - Diastolic murmurs L heart cath 04/2915: 1. Two-vessel coronary artery disease with high-grade 95% stenosis mid LAD and 95% stenosis distal RCA 2. Normal left ventricular function 3. Successful PCI with 2.75 x 15 mm Xience Alpine stent mid LAD, and 2.25 x 23 mm Xience Alpine stent distal RCA   Neuro/Psych PSYCHIATRIC DISORDERS Anxiety Depression negative neurological ROS     GI/Hepatic negative GI ROS, Neg liver ROS,   Endo/Other  negative endocrine ROSneg diabetes  Renal/GU negative Renal ROS     Musculoskeletal negative musculoskeletal ROS (+)   Abdominal (+) - obese,   Peds  Hematology negative hematology ROS (+)   Anesthesia Other Findings Past Medical History: No date: Anginal pain ([place]) No date: Anxiety No date: Cancer ([place])     Comment:  melanoma No date: Colon polyp No date: Depression No date: Hemorrhoids No date: Hypercholesteremia No date: Hypertension No date: Tobacco abuse   Reproductive/Obstetrics                             Anesthesia Physical Anesthesia Plan  ASA: III  Anesthesia Plan: General   Post-op Pain Management:    Induction: Intravenous  PONV Risk Score and Plan: 2 and Propofol infusion  Airway Management Planned: Natural  Airway  Additional Equipment:   Intra-op Plan:   Post-operative Plan:   Informed Consent: I have reviewed the patients History and Physical, chart, labs and discussed the procedure including the risks, benefits and alternatives for the proposed anesthesia with the patient or authorized representative who has indicated his/her understanding and acceptance.   Dental advisory given  Plan Discussed with: CRNA and Anesthesiologist  Anesthesia Plan Comments:         Anesthesia Quick Evaluation

## 2017-04-05 NOTE — H&P (Signed)
Primary Care Physician:  Idelle Crouch, MD Primary Gastroenterologist:  Dr. Vira Agar  Pre-Procedure History & Physical: HPI:  Chad Terry is a 61 y.o. male is here for an colonoscopy.   Past Medical History:  Diagnosis Date  . Anginal pain ([place])   . Anxiety   . Cancer ([place])    melanoma  . Colon polyp   . Depression   . Hemorrhoids   . Hypercholesteremia   . Hypertension   . Tobacco abuse     Past Surgical History:  Procedure Laterality Date  . ankle anthroplasty    . ANKLE ARTHROSCOPY    . cardiac stents     x2  . COLONOSCOPY    . PENILE PROSTHESIS IMPLANT      Prior to Admission medications   Medication Sig Start Date End Date Taking? Authorizing Provider  ALPRAZolam (XANAX XR) 2 MG 24 hr tablet Take 2 mg by mouth 3 (three) times daily. Takes at bedtime one pill, other only if needed   Yes [provider]  aspirin 81 MG tablet Take 81 mg by mouth daily.   Yes [provider]  metoprolol succinate (TOPROL-XL) 50 MG 24 hr tablet Take 50 mg by mouth daily. Take with or immediately following a meal.   Yes [provider]  Potassium Gluconate 550 (90 K) MG TABS Take 550 mg by mouth every morning. Written as 550mg (90mg )?tab   Yes [provider]  sertraline (ZOLOFT) 100 MG tablet Take 150 mg by mouth at bedtime.   Yes [provider]  simvastatin (ZOCOR) 40 MG tablet Take 40 mg by mouth daily.   Yes [provider]  triamterene-hydrochlorothiazide (DYAZIDE) 37.5-25 MG capsule Take 1 capsule by mouth daily.   Yes [provider]  clopidogrel (PLAVIX) 75 MG tablet Take 1 tablet (75 mg total) by mouth daily with breakfast. 05/24/15   Paraschos, Alexander, MD  dimenhyDRINATE (DRAMAMINE) 50 MG tablet Take 50 mg by mouth daily at 10 pm. 4 tablet at bedtime    [provider]  Multiple Vitamin (MULTIVITAMIN) capsule Take 1 capsule by mouth daily.    [provider]  Omega-3 Fatty Acids  (OMEGA-3 FISH OIL PO) Take 360-1,200 mg by mouth every morning. Omega 3-fish oil 360-1200mg  daily not in database    [provider]    Allergies as of 01/18/2017  . (No Known Allergies)    Family History  Problem Relation Age of Onset  . Colon polyps Father   . CAD Other   . Prostate cancer Other   . Hypertension Other     Social History   Socioeconomic History  . Marital status: Widowed    Spouse name: Not on file  . Number of children: Not on file  . Years of education: Not on file  . Highest education level: Not on file  Social Needs  . Financial resource strain: Not on file  . Food insecurity - worry: Not on file  . Food insecurity - inability: Not on file  . Transportation needs - medical: Not on file  . Transportation needs - non-medical: Not on file  Occupational History  . Not on file  Tobacco Use  . Smoking status: Current Every Day Smoker    Packs/day: 1.50    Years: 15.00    Pack years: 22.50    Types: E-cigarettes  . Smokeless tobacco: Never Used  Substance and Sexual Activity  . Alcohol use: Yes    Alcohol/week: 8.4 oz  Types: 14 Shots of liquor per week    Comment: last 24hrs  . Drug use: No  . Sexual activity: Not on file  Other Topics Concern  . Not on file  Social History Narrative  . Not on file    Review of Systems: See HPI, otherwise negative ROS  Physical Exam: BP 114/78   Pulse 73   Temp 98.4 F (36.9 C) (Tympanic)   Resp 20   SpO2 97%  General:   Alert,  pleasant and cooperative in NAD Head:  Normocephalic and atraumatic. Neck:  Supple; no masses or thyromegaly. Lungs:  Clear throughout to auscultation.    Heart:  Regular rate and rhythm. Abdomen:  Soft, nontender and nondistended. Normal bowel sounds, without guarding, and without rebound.   Neurologic:  Alert and  oriented x4;  grossly normal neurologically.  Impression/Plan: Cowen Pesqueira is here for an colonoscopy to be performed for screening family  history colon polyps.  Risks, benefits, limitations, and alternatives regarding  colonoscopy have been reviewed with the patient.  Questions have been answered.  All parties agreeable.   Gaylyn Cheers, MD  04/05/2017, 11:39 AM

## 2017-04-05 NOTE — Anesthesia Postprocedure Evaluation (Signed)
Anesthesia Post Note  Patient: Chad Terry  Procedure(s) Performed: COLONOSCOPY WITH PROPOFOL (N/A )  Patient location during evaluation: Endoscopy Anesthesia Type: General Level of consciousness: awake and alert and oriented Pain management: pain level controlled Vital Signs Assessment: post-procedure vital signs reviewed and stable Respiratory status: spontaneous breathing, nonlabored ventilation and respiratory function stable Cardiovascular status: blood pressure returned to baseline and stable Postop Assessment: no signs of nausea or vomiting Anesthetic complications: no     Last Vitals:  Vitals:   04/05/17 1056 04/05/17 1223  BP: 114/78 95/62  Pulse: 73   Resp: 20   Temp: 36.9 C (!) 36.1 C  SpO2: 97%     Last Pain:  Vitals:   04/05/17 1223  TempSrc: Tympanic                 Shakirra Buehler

## 2017-04-05 NOTE — Op Note (Signed)
[facility] Patient Name: Chad Terry Procedure Date: 04/05/2017 11:32 AM MRN: [id number] Account #: [id number] Date of Birth: Sep 08, 1955 Admit Type: Outpatient Age: 61 Room: [facility] ENDO ROOM 1 Gender: Male Note Status: Finalized Procedure:            Colonoscopy Indications:          Screening for colorectal malignant neoplasm, Colon                        cancer screening in patient at increased risk: Family                        history of 1st-degree relative with colon polyps Providers:            Manya Silvas, MD Referring MD:         Leonie Douglas. Doy Hutching, MD (Referring MD) Medicines:            Propofol per Anesthesia Complications:        No immediate complications. Procedure:            Pre-Anesthesia Assessment:                       - After reviewing the risks and benefits, the patient                        was deemed in satisfactory condition to undergo the                        procedure.                       After obtaining informed consent, the colonoscope was                        passed under direct vision. Throughout the procedure,                        the patient's blood pressure, pulse, and oxygen                        saturations were monitored continuously. The                        Colonoscope was introduced through the anus and                        advanced to the the cecum, identified by appendiceal                        orifice and ileocecal valve. The colonoscopy was                        performed without difficulty. The patient tolerated the                        procedure well. The quality of the bowel preparation                        was good. Findings:      A diminutive polyp was found in the descending colon. The polyp was  sessile. The polyp was removed with a jumbo cold forceps. Resection and       retrieval were complete.      Two sessile polyps were found in the sigmoid colon. The  polyps were       small in size. These polyps were removed with a hot snare. Resection and       retrieval were complete.      Four sessile polyps were found in the recto-sigmoid colon. The polyps       were small in size. These polyps were removed with a hot snare.       Resection and retrieval were complete.      The exam was otherwise without abnormality. Impression:           - One diminutive polyp in the descending colon, removed                        with a jumbo cold forceps. Resected and retrieved.                       - Two small polyps in the sigmoid colon, removed with a                        hot snare. Resected and retrieved.                       - Four small polyps at the recto-sigmoid colon, removed                        with a hot snare. Resected and retrieved.                       - The examination was otherwise normal. Recommendation:       - Await pathology results. Manya Silvas, MD 04/05/2017 12:25:02 PM This report has been signed electronically. Number of Addenda: 0 Note Initiated On: 04/05/2017 11:32 AM Scope Withdrawal Time: 0 hours 27 minutes 54 seconds  Total Procedure Duration: 0 hours 32 minutes 55 seconds       [facility]

## 2017-04-06 ENCOUNTER — Encounter: Payer: Self-pay | Admitting: Unknown Physician Specialty

## 2017-04-06 LAB — SURGICAL PATHOLOGY

## 2018-02-02 ENCOUNTER — Other Ambulatory Visit: Payer: Self-pay

## 2018-02-02 ENCOUNTER — Emergency Department
Admission: EM | Admit: 2018-02-02 | Discharge: 2018-02-02 | Disposition: A | Discharge status: Home / Self Care | Payer: BLUE CROSS/BLUE SHIELD | Attending: Emergency Medicine | Admitting: Emergency Medicine

## 2018-02-02 ENCOUNTER — Encounter: Payer: Self-pay | Admitting: Emergency Medicine

## 2018-02-02 ENCOUNTER — Emergency Department: Payer: BLUE CROSS/BLUE SHIELD

## 2018-02-02 DIAGNOSIS — Y998 Other external cause status: Secondary | ICD-10-CM | POA: Diagnosis not present

## 2018-02-02 DIAGNOSIS — I10 Essential (primary) hypertension: Secondary | ICD-10-CM | POA: Insufficient documentation

## 2018-02-02 DIAGNOSIS — Y92018 Other place in single-family (private) house as the place of occurrence of the external cause: Secondary | ICD-10-CM | POA: Diagnosis not present

## 2018-02-02 DIAGNOSIS — Z7982 Long term (current) use of aspirin: Secondary | ICD-10-CM | POA: Diagnosis not present

## 2018-02-02 DIAGNOSIS — F17290 Nicotine dependence, other tobacco product, uncomplicated: Secondary | ICD-10-CM | POA: Diagnosis not present

## 2018-02-02 DIAGNOSIS — Z7902 Long term (current) use of antithrombotics/antiplatelets: Secondary | ICD-10-CM | POA: Diagnosis not present

## 2018-02-02 DIAGNOSIS — W08XXXA Fall from other furniture, initial encounter: Secondary | ICD-10-CM | POA: Diagnosis not present

## 2018-02-02 DIAGNOSIS — S20212A Contusion of left front wall of thorax, initial encounter: Secondary | ICD-10-CM | POA: Insufficient documentation

## 2018-02-02 DIAGNOSIS — S29001A Unspecified injury of muscle and tendon of front wall of thorax, initial encounter: Secondary | ICD-10-CM | POA: Diagnosis present

## 2018-02-02 DIAGNOSIS — Z79899 Other long term (current) drug therapy: Secondary | ICD-10-CM | POA: Diagnosis not present

## 2018-02-02 DIAGNOSIS — Z85828 Personal history of other malignant neoplasm of skin: Secondary | ICD-10-CM | POA: Insufficient documentation

## 2018-02-02 DIAGNOSIS — Y9384 Activity, sleeping: Secondary | ICD-10-CM | POA: Insufficient documentation

## 2018-02-02 MED ORDER — NAPROXEN 500 MG PO TABS: 500.0000 mg | ORAL_TABLET | Freq: Two times a day (BID) | ORAL | Status: DC

## 2018-02-02 NOTE — ED Provider Notes (Signed)
[facility] Emergency Department Provider Note   ____________________________________________   First MD Initiated Contact with Patient 02/02/18 1116     (approximate)  I have reviewed the triage vital signs and the nursing notes.   HISTORY  Chief Complaint Rib Pain    HPI Ronn Smolinsky is a 62 y.o. male patient complaining of left rib pain secondary to falling off the couch while sleeping a week ago.  Patient state continue to have guarding with palpation and deep inspirations.  Patient denies dyspnea.  Past Medical History:  Diagnosis Date  . Anginal pain ([place])   . Anxiety   . Cancer ([place])    melanoma  . Colon polyp   . Depression   . Hemorrhoids   . Hypercholesteremia   . Hypertension   . Tobacco abuse     Patient Active Problem List   Diagnosis Date Noted  . Chest pain at rest 05/23/2015    Past Surgical History:  Procedure Laterality Date  . ankle anthroplasty    . ANKLE ARTHROSCOPY    . CARDIAC CATHETERIZATION N/A 05/23/2015   Procedure: Left Heart Cath and Coronary Angiography;  Surgeon: Isaias Cowman, MD;  Location: [place] CV LAB;  Service: Cardiovascular;  Laterality: N/A;  . CARDIAC CATHETERIZATION N/A 05/23/2015   Procedure: Coronary Stent Intervention;  Surgeon: Isaias Cowman, MD;  Location: [place] CV LAB;  Service: Cardiovascular;  Laterality: N/A;  . cardiac stents     x2  . COLONOSCOPY    . COLONOSCOPY WITH PROPOFOL N/A 04/05/2017   Procedure: COLONOSCOPY WITH PROPOFOL;  Surgeon: Manya Silvas, MD;  Location: [facility] ENDOSCOPY;  Service: Endoscopy;  Laterality: N/A;  . PENILE PROSTHESIS IMPLANT      Prior to Admission medications   Medication Sig Start Date End Date Taking? Authorizing Provider  ALPRAZolam (XANAX XR) 2 MG 24 hr tablet Take 2 mg by mouth 3 (three) times daily. Takes at bedtime one pill, other only if needed    [provider]  aspirin 81 MG tablet Take 81 mg  by mouth daily.    [provider]  clopidogrel (PLAVIX) 75 MG tablet Take 1 tablet (75 mg total) by mouth daily with breakfast. 05/24/15   Paraschos, Alexander, MD  dimenhyDRINATE (DRAMAMINE) 50 MG tablet Take 50 mg by mouth daily at 10 pm. 4 tablet at bedtime    [provider]  metoprolol succinate (TOPROL-XL) 50 MG 24 hr tablet Take 50 mg by mouth daily. Take with or immediately following a meal.    [provider]  Multiple Vitamin (MULTIVITAMIN) capsule Take 1 capsule by mouth daily.    [provider]  naproxen (NAPROSYN) 500 MG tablet Take 1 tablet (500 mg total) by mouth 2 (two) times daily with a meal. 02/02/18   Sable Feil, PA-C  Omega-3 Fatty Acids (OMEGA-3 FISH OIL PO) Take 360-1,200 mg by mouth every morning. Omega 3-fish oil 360-1200mg  daily not in database    [provider]  Potassium Gluconate 550 (90 K) MG TABS Take 550 mg by mouth every morning. Written as 550mg (90mg )?tab    [provider]  sertraline (ZOLOFT) 100 MG tablet Take 150 mg by mouth at bedtime.    [provider]  simvastatin (ZOCOR) 40 MG tablet Take 40 mg by mouth daily.    [provider]  triamterene-hydrochlorothiazide (DYAZIDE) 37.5-25 MG capsule Take 1 capsule by mouth daily.    [provider]    Allergies Patient has no  known allergies.  Family History  Problem Relation Age of Onset  . Colon polyps Father   . CAD Other   . Prostate cancer Other   . Hypertension Other     Social History Social History   Tobacco Use  . Smoking status: Current Every Day Smoker    Packs/day: 1.50    Years: 15.00    Pack years: 22.50    Types: E-cigarettes  . Smokeless tobacco: Never Used  Substance Use Topics  . Alcohol use: Yes    Alcohol/week: 14.0 standard drinks    Types: 14 Shots of liquor per week    Comment: last 24hrs  . Drug use: No    Review of Systems Constitutional: No fever/chills Eyes: No visual  changes. ENT: No sore throat. Cardiovascular: Denies chest pain. Respiratory: Denies shortness of breath. Gastrointestinal: No abdominal pain.  No nausea, no vomiting.  No diarrhea.  No constipation. Genitourinary: Negative for dysuria. Musculoskeletal: Left anterior rib pain Skin: Negative for rash. Neurological: Negative for headaches, focal weakness or numbness. Psychiatric:Anxiety and depression. Endocrine:Hyperlipidemia hypertension.  ____________________________________________   PHYSICAL EXAM:  VITAL SIGNS: ED Triage Vitals  Enc Vitals Group     BP 02/02/18 1049 131/81     Pulse Rate 02/02/18 1049 78     Resp 02/02/18 1049 16     Temp 02/02/18 1049 98.9 F (37.2 C)     Temp Source 02/02/18 1049 Oral     SpO2 02/02/18 1049 97 %     Weight --      Height --      Head Circumference --      Peak Flow --      Pain Score 02/02/18 1050 0     Pain Loc --      Pain Edu? --      Excl. in [place]? --    Constitutional: Alert and oriented. Well appearing and in no acute distress. Neck: No stridor.  No cervical spine tenderness to palpation. Cardiovascular: Normal rate, regular rhythm. Grossly normal heart sounds.  Good peripheral circulation. Respiratory: Normal respiratory effort.  No retractions. Lungs CTAB. Musculoskeletal: No lower extremity tenderness nor edema.  No joint effusions. Neurologic:  Normal speech and language. No gross focal neurologic deficits are appreciated. No gait instability. Skin:  Skin is warm, dry and intact. No rash noted. Psychiatric: Mood and affect are normal. Speech and behavior are normal.  ____________________________________________   LABS (all labs ordered are listed, but only abnormal results are displayed)  Labs Reviewed - No data to display ____________________________________________  EKG   ____________________________________________  RADIOLOGY  ED MD interpretation: EKG read by heart stationDoctor with no acute  findings.  Official radiology report(s): Dg Ribs Unilateral W/chest Left  Result Date: 02/02/2018 CLINICAL DATA:  Anterior left rib pain. The patient may have fallen off of his sofa while sleeping in the past week. Initial encounter. EXAM: LEFT RIBS AND CHEST - 3+ VIEW COMPARISON:  PA and lateral chest 05/02/2015. FINDINGS: The lungs are clear. Heart size is normal. No pneumothorax or pleural effusion. No rib fracture or other focal bony abnormality is identified. IMPRESSION: Negative for fracture.  Negative exam. Electronically Signed   By: Inge Rise M.D.   On: 02/02/2018 11:56    ____________________________________________   PROCEDURES  Procedure(s) performed: None  Procedures  Critical Care performed: No  ____________________________________________   INITIAL IMPRESSION / ASSESSMENT AND PLAN / ED COURSE  As part of my medical decision making, I reviewed the following data within  the electronic MEDICAL RECORD NUMBER    Left rib pain secondary to contusion.  Discussed negative chest x-ray findings with patient.  Patient given discharge care instruction advised take medication as directed.  Patient advised follow-up PCP.      ____________________________________________   FINAL CLINICAL IMPRESSION(S) / ED DIAGNOSES  Final diagnoses:  Rib contusion, left, initial encounter     ED Discharge Orders         Ordered    naproxen (NAPROSYN) 500 MG tablet  2 times daily with meals     02/02/18 1211           Note:  This document was prepared using Dragon voice recognition software and may include unintentional dictation errors.    Sable Feil, PA-C 02/02/18 1215    Drenda Freeze, MD 02/02/18 [phone]

## 2018-02-02 NOTE — ED Triage Notes (Signed)
Pt to ED via POV c/o Rib pain on the left side. Pt states that he sleeps on the couch and last week he woke up in the floor. Pt states that the pain is getting better every day but it is still tender to palpation. Pt would like to have x-ray. Pt is in NAD at this time.

## 2018-02-02 NOTE — ED Notes (Signed)
See triage note  Presents with pain to left side of chest  States he sleeps on the sofa and had rolled off several times  Last time was couple of days ago  Having pain with movement and touch

## 2018-05-04 ENCOUNTER — Emergency Department: Payer: BLUE CROSS/BLUE SHIELD

## 2018-05-04 ENCOUNTER — Emergency Department
Admission: EM | Admit: 2018-05-04 | Discharge: 2018-05-04 | Disposition: A | Discharge status: Home / Self Care | Payer: BLUE CROSS/BLUE SHIELD | Attending: Emergency Medicine | Admitting: Emergency Medicine

## 2018-05-04 ENCOUNTER — Other Ambulatory Visit: Payer: Self-pay

## 2018-05-04 DIAGNOSIS — Z79899 Other long term (current) drug therapy: Secondary | ICD-10-CM | POA: Diagnosis not present

## 2018-05-04 DIAGNOSIS — W1839XA Other fall on same level, initial encounter: Secondary | ICD-10-CM | POA: Diagnosis not present

## 2018-05-04 DIAGNOSIS — S0990XA Unspecified injury of head, initial encounter: Secondary | ICD-10-CM

## 2018-05-04 DIAGNOSIS — T148XXA Other injury of unspecified body region, initial encounter: Secondary | ICD-10-CM

## 2018-05-04 DIAGNOSIS — Z7982 Long term (current) use of aspirin: Secondary | ICD-10-CM | POA: Insufficient documentation

## 2018-05-04 DIAGNOSIS — I10 Essential (primary) hypertension: Secondary | ICD-10-CM | POA: Insufficient documentation

## 2018-05-04 DIAGNOSIS — W19XXXA Unspecified fall, initial encounter: Secondary | ICD-10-CM

## 2018-05-04 DIAGNOSIS — I951 Orthostatic hypotension: Secondary | ICD-10-CM | POA: Diagnosis not present

## 2018-05-04 DIAGNOSIS — F17210 Nicotine dependence, cigarettes, uncomplicated: Secondary | ICD-10-CM | POA: Insufficient documentation

## 2018-05-04 DIAGNOSIS — E119 Type 2 diabetes mellitus without complications: Secondary | ICD-10-CM | POA: Diagnosis not present

## 2018-05-04 DIAGNOSIS — R51 Headache: Secondary | ICD-10-CM | POA: Diagnosis present

## 2018-05-04 LAB — PROTIME-INR
INR: 0.93
Prothrombin Time: 12.4 seconds (ref 11.4–15.2)

## 2018-05-04 LAB — CBC
HEMATOCRIT: 47.8 % (ref 39.0–52.0)
Hemoglobin: 16.7 g/dL (ref 13.0–17.0)
MCH: 33 pg (ref 26.0–34.0)
MCHC: 34.9 g/dL (ref 30.0–36.0)
MCV: 94.5 fL (ref 80.0–100.0)
Platelets: 239 10*3/uL (ref 150–400)
RBC: 5.06 MIL/uL (ref 4.22–5.81)
RDW: 13.4 % (ref 11.5–15.5)
WBC: 7.1 10*3/uL (ref 4.0–10.5)
nRBC: 0 % (ref 0.0–0.2)

## 2018-05-04 LAB — BASIC METABOLIC PANEL
Anion gap: 9 (ref 5–15)
BUN: 25 mg/dL — AB (ref 8–23)
CO2: 26 mmol/L (ref 22–32)
Calcium: 8.7 mg/dL — ABNORMAL LOW (ref 8.9–10.3)
Chloride: 103 mmol/L (ref 98–111)
Creatinine, Ser: 1 mg/dL (ref 0.61–1.24)
GFR calc Af Amer: >60 mL/min (ref >60)
GFR calc non Af Amer: >60 mL/min (ref >60)
Glucose, Bld: 151 mg/dL — ABNORMAL HIGH (ref 70–99)
POTASSIUM: 3.6 mmol/L (ref 3.5–5.1)
Sodium: 138 mmol/L (ref 135–145)

## 2018-05-04 LAB — TROPONIN I

## 2018-05-04 NOTE — ED Provider Notes (Signed)
[facility] Emergency Department Provider Note  Time seen: 5:10 PM  I have reviewed the triage vital signs and the nursing notes.   HISTORY  Chief Complaint Fall    HPI Chad Terry is a 62 y.o. male with a past medical history of angina, status post 2 stents, hypertension, hyperlipidemia, sleep apnea wears a CPAP machine, presents to the emergency department after 2 falls and a head injury.  According to the patient for the past 1 month he has been using a CPAP machine and states he has had very good results as he has had a lot of difficulty sleeping in the past with snoring and waking up frequently.  However he states 2 days ago the CPAP machine broke they are sending him a replacement which will be here tomorrow.  Patient was without his CPAP last night, states he woke the middle the night feeling like he needed to urinate, states he stood up from bed walk 2 or 3 steps and then fell forwards hitting his head on a chair.  Patient states he got right up and tried to walk again and fell once again.  Patient noted that he had blood on his forehead and he cleaned himself up but he was still very tired so he went back to sleep.  He states for the past several years he has had issues of getting lightheaded and dizzy if he stands up too quickly.  Patient saw his daughter today who he saw his head which still had dried blood on the head and recommended that he come to the hospital.  Here patient appears well, admits to anxiety denies any headache denies any weakness or numbness.  Overall appears very well.   Past Medical History:  Diagnosis Date  . Anginal pain ([place])   . Anxiety   . Cancer ([place])    melanoma  . Colon polyp   . Depression   . Hemorrhoids   . Hypercholesteremia   . Hypertension   . Tobacco abuse     Patient Active Problem List   Diagnosis Date Noted  . Chest pain at rest 05/23/2015    Past Surgical History:  Procedure Laterality Date  .  ankle anthroplasty    . ANKLE ARTHROSCOPY    . CARDIAC CATHETERIZATION N/A 05/23/2015   Procedure: Left Heart Cath and Coronary Angiography;  Surgeon: Isaias Cowman, MD;  Location: [place] CV LAB;  Service: Cardiovascular;  Laterality: N/A;  . CARDIAC CATHETERIZATION N/A 05/23/2015   Procedure: Coronary Stent Intervention;  Surgeon: Isaias Cowman, MD;  Location: [place] CV LAB;  Service: Cardiovascular;  Laterality: N/A;  . cardiac stents     x2  . COLONOSCOPY    . COLONOSCOPY WITH PROPOFOL N/A 04/05/2017   Procedure: COLONOSCOPY WITH PROPOFOL;  Surgeon: Manya Silvas, MD;  Location: [facility] ENDOSCOPY;  Service: Endoscopy;  Laterality: N/A;  . PENILE PROSTHESIS IMPLANT      Prior to Admission medications   Medication Sig Start Date End Date Taking? Authorizing Provider  ALPRAZolam (XANAX XR) 2 MG 24 hr tablet Take 2 mg by mouth 3 (three) times daily. Takes at bedtime one pill, other only if needed    [provider]  aspirin 81 MG tablet Take 81 mg by mouth daily.    [provider]  clopidogrel (PLAVIX) 75 MG tablet Take 1 tablet (75 mg total) by mouth daily with breakfast. 05/24/15   Paraschos, Alexander, MD  dimenhyDRINATE (DRAMAMINE) 50 MG tablet Take 50  mg by mouth daily at 10 pm. 4 tablet at bedtime    [provider]  metoprolol succinate (TOPROL-XL) 50 MG 24 hr tablet Take 50 mg by mouth daily. Take with or immediately following a meal.    [provider]  Multiple Vitamin (MULTIVITAMIN) capsule Take 1 capsule by mouth daily.    [provider]  naproxen (NAPROSYN) 500 MG tablet Take 1 tablet (500 mg total) by mouth 2 (two) times daily with a meal. 02/02/18   Sable Feil, PA-C  Omega-3 Fatty Acids (OMEGA-3 FISH OIL PO) Take 360-1,200 mg by mouth every morning. Omega 3-fish oil 360-1200mg  daily not in database    [provider]  Potassium Gluconate 550 (90 K) MG TABS Take 550 mg by mouth every morning.  Written as 550mg (90mg )?tab    [provider]  sertraline (ZOLOFT) 100 MG tablet Take 150 mg by mouth at bedtime.    [provider]  simvastatin (ZOCOR) 40 MG tablet Take 40 mg by mouth daily.    [provider]  triamterene-hydrochlorothiazide (DYAZIDE) 37.5-25 MG capsule Take 1 capsule by mouth daily.    [provider]    No Known Allergies  Family History  Problem Relation Age of Onset  . Colon polyps Father   . CAD Other   . Prostate cancer Other   . Hypertension Other     Social History Social History   Tobacco Use  . Smoking status: Current Every Day Smoker    Packs/day: 1.50    Years: 15.00    Pack years: 22.50    Types: E-cigarettes  . Smokeless tobacco: Never Used  Substance Use Topics  . Alcohol use: Yes    Alcohol/week: 14.0 standard drinks    Types: 14 Shots of liquor per week    Comment: last 24hrs  . Drug use: No    Review of Systems Constitutional: Positive for fall, unclear if he lost consciousness. Eyes: Negative for visual complaints ENT: States deviated septum but no acute issues. Cardiovascular: Negative for chest pain. Respiratory: Negative for shortness of breath. Gastrointestinal: Negative for abdominal pain, vomiting  Skin: Dried blood to the head Neurological: Negative for headache All other ROS negative  ____________________________________________   PHYSICAL EXAM:  VITAL SIGNS: ED Triage Vitals  Enc Vitals Group     BP 05/04/18 1423 (!) 159/71     Pulse Rate 05/04/18 1423 83     Resp 05/04/18 1423 18     Temp 05/04/18 1423 98.1 F (36.7 C)     Temp src --      SpO2 05/04/18 1423 96 %     Weight 05/04/18 1424 208 lb (94.3 kg)     Height 05/04/18 1424 5\' 9"  (1.753 m)     Head Circumference --      Peak Flow --      Pain Score 05/04/18 1424 0     Pain Loc --      Pain Edu? --      Excl. in [place]? --    Constitutional: Alert and oriented. Well appearing and in no distress. Eyes: Normal  exam ENT   Head: Patient has dried blood on said we will clean up to see if there is any lacerations or abrasions present.   Mouth/Throat: Mucous membranes are moist. Cardiovascular: Normal rate, regular rhythm. No murmur Respiratory: Normal respiratory effort without tachypnea nor retractions. Breath sounds are clear Gastrointestinal: Soft and nontender. No distention. Musculoskeletal: Nontender with normal range of motion  in all extremities.  Neurologic:  Normal speech and language. No gross focal neurologic deficits Skin:  Skin is warm, dry and intact.  Psychiatric: Somewhat anxious but overall appropriate for situation.  ____________________________________________    EKG  EKG reviewed and interpreted by myself shows a sinus rhythm at 83 bpm with a narrow QRS, normal axis, normal intervals, nonspecific ST changes.  ____________________________________________    RADIOLOGY  CT head negative  ____________________________________________   INITIAL IMPRESSION / ASSESSMENT AND PLAN / ED COURSE  Pertinent labs & imaging results that were available during my care of the patient were reviewed by me and considered in my medical decision making (see chart for details).  Patient presents to the emergency department after a fall last night.  Patient suffered a head injury.  Overall patient appears very well, his symptoms/history seem most consistent with orthostatic hypotension.  He states for the past several years he has had dizziness and lightheadedness if he stands too quickly, admits that he did not sit on the side of the bed as he was instructed to do, he stood up and fell shortly after standing.  The second fall was shortly after standing again.  Patient states today he has felt normal but continues to have dried blood on his forehead.  Somewhat anxious appearing.  Differential this time would include concussion, ICH, closed head injury, abrasion or laceration.  We will clean  the dried blood to see if the patient has suffered an abrasion versus laceration.  Patient's lab work including cardiac enzymes are negative.  CT scan of the head is negative.  Patient's work-up is essentially negative.  Patient appears very well.  After cleaning the area there are several abrasions none of which are amenable to suturing.  We will cover with Dermabond to achieve hemostasis.  We will discharge with PCP follow-up.  Patient agreeable to plan of care.  I discussed orthostatic precautions as well as return precautions.  ____________________________________________   FINAL CLINICAL IMPRESSION(S) / ED DIAGNOSES  Orthostatic hypotension Fall Head injury    Harvest Dark, MD 05/04/18 [place] Emerald

## 2018-05-04 NOTE — ED Triage Notes (Addendum)
Pt comes via POV from home with c/o of fall. Pt states he fell last night. Pt states he woke up in middle of night to go to bathroom.   States he fell and hit a chair probably from getting up just a little too fast.  Pt states he got up from floor and headed to bathroom and ended up falling again and hit another chair. Pt states some possible dizziness.  Pt unsure of LOC, unsure if he hit his head. Pt is on [place]. Pt also states he has 2 stents placed but denies any chest pain. Pt has bruising and knot noted to right area under nipple. Pt denies any pain in that area.  Pt has dried blood on forehead. No active bleeding. Pt is alert and oriented.

## 2019-04-26 IMAGING — CT CT HEAD W/O CM
3 series · 15 of 47 positions shown, 18 images · non-contrast
Comparison: None.

CLINICAL DATA: Fall with head trauma. On anti coagulation therapy.

EXAM:
CT HEAD WITHOUT CONTRAST
TECHNIQUE: Contiguous axial images were obtained from the base of the skull
through the vertex without intravenous contrast.

[Series 2: head wo · axial · 0.42mm/px · z∈[+117,+242]mm · 9 of 30 slices shown, 12 images]
[im 3/30  brain]
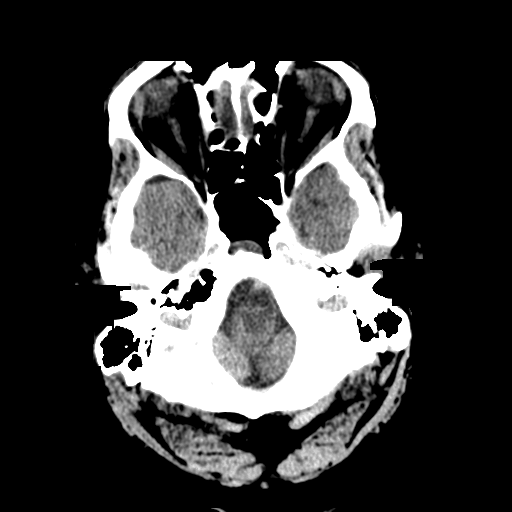
[im 3/30  bone]
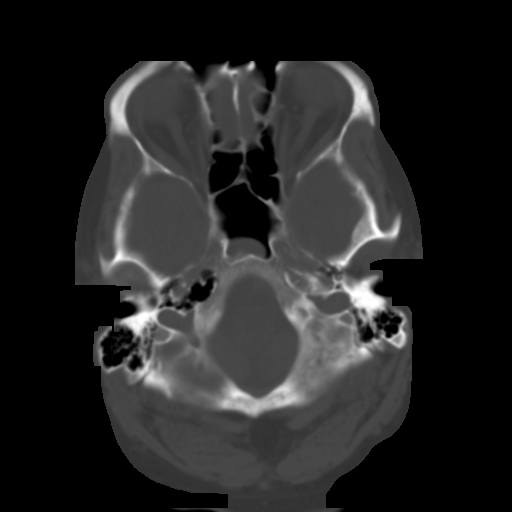
[im 6/30  brain]
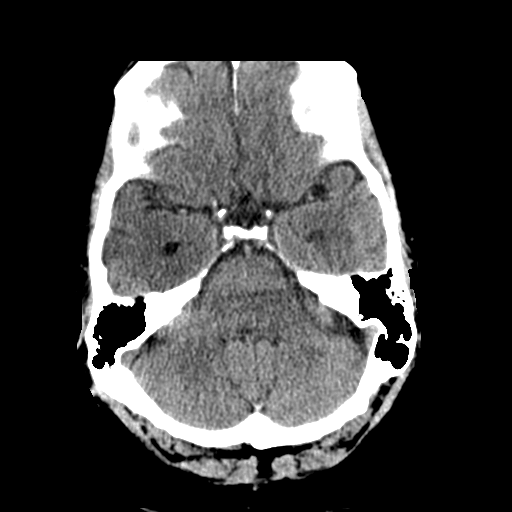
[im 9/30  brain]
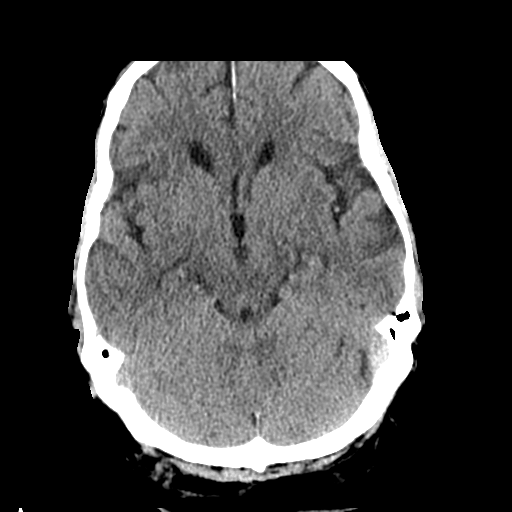
[im 12/30  brain]
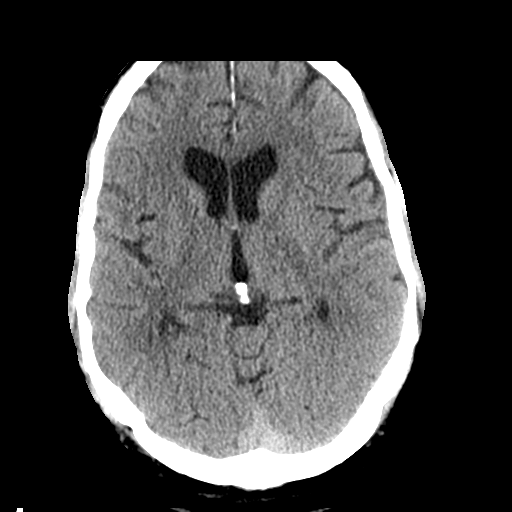
[im 16/30  brain]
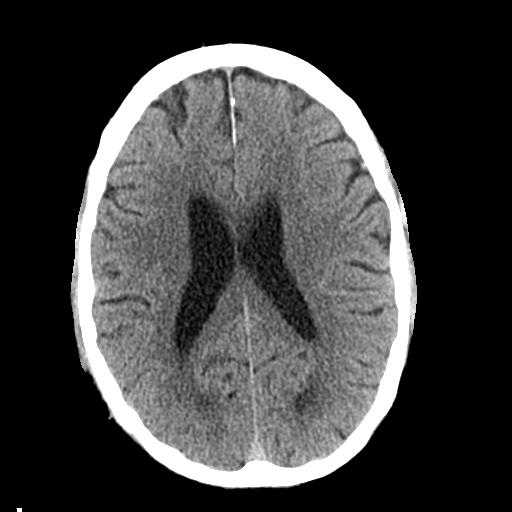
[im 16/30  bone]
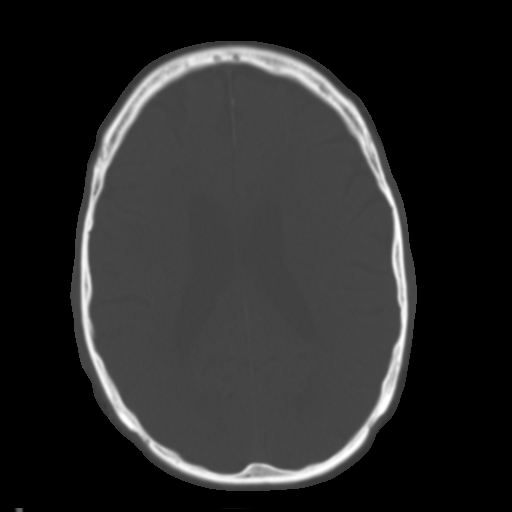
[im 19/30  brain]
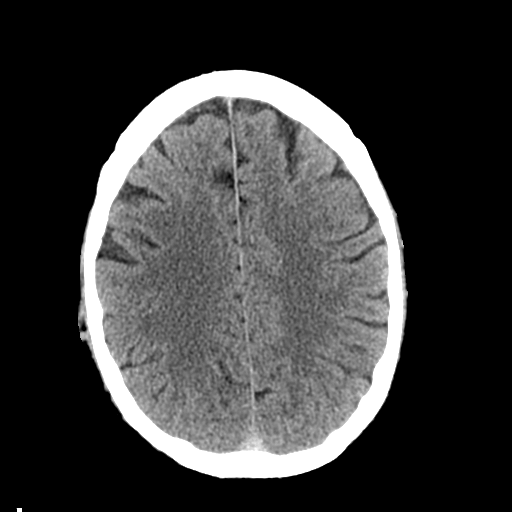
[im 22/30  brain]
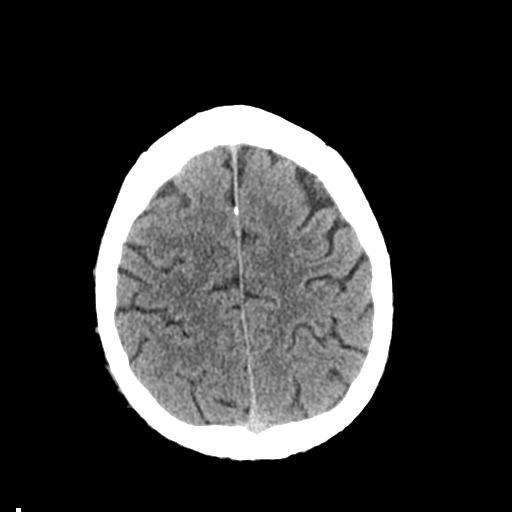
[im 25/30  brain]
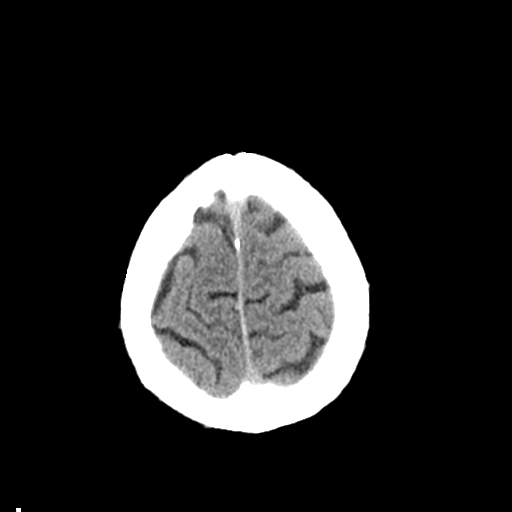
[im 28/30  brain]
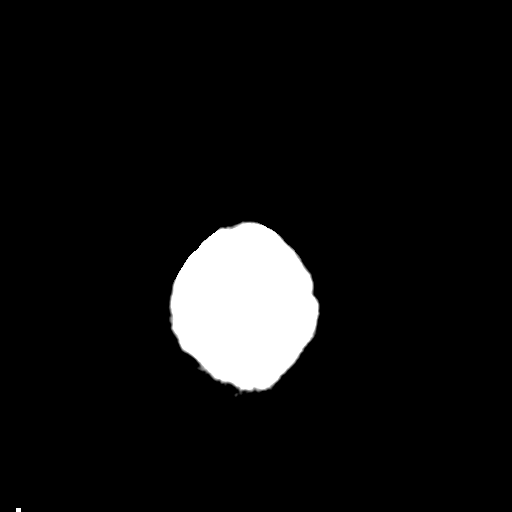
[im 28/30  bone]
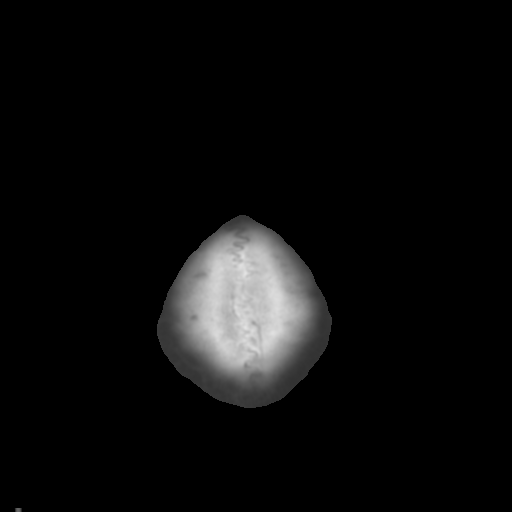

[Series 4: coronal soft tissue · coronal · 0.30mm/px · 3 of 70 slices shown]
[im 24/70  brain]
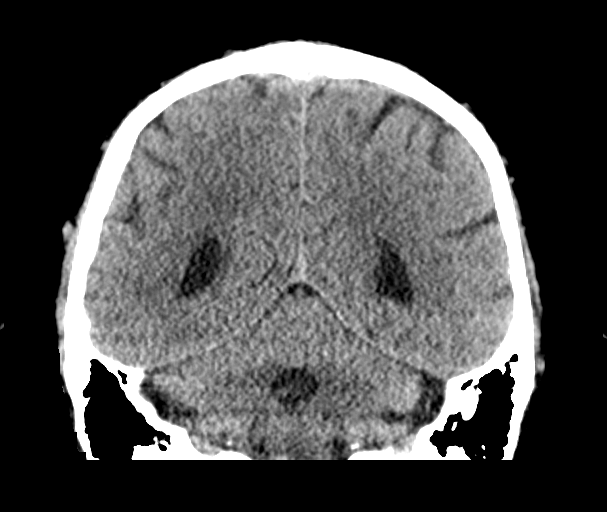
[im 31/70  brain]
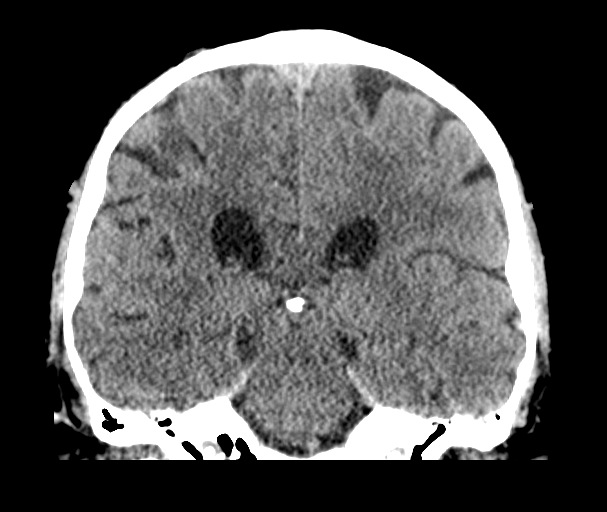
[im 39/70  brain]
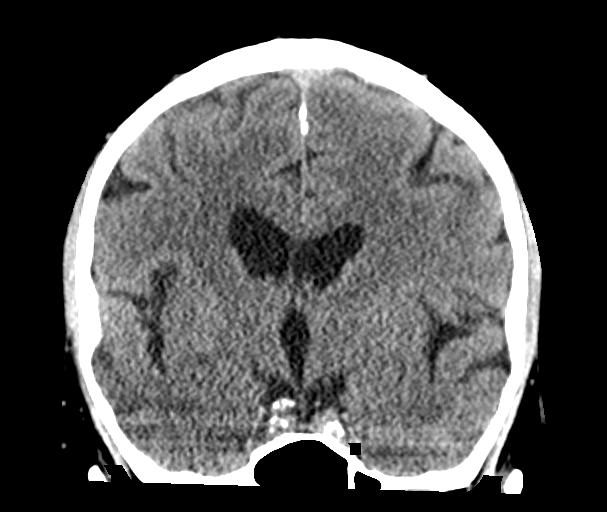

[Series 5: sagittal soft tissue · sagittal · 0.30mm/px · 3 of 55 slices shown]
[im 19/55  brain]
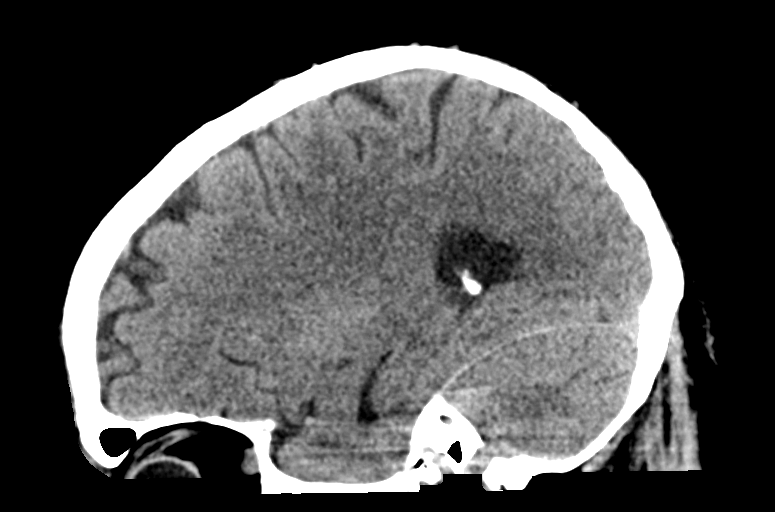
[im 28/55  brain]
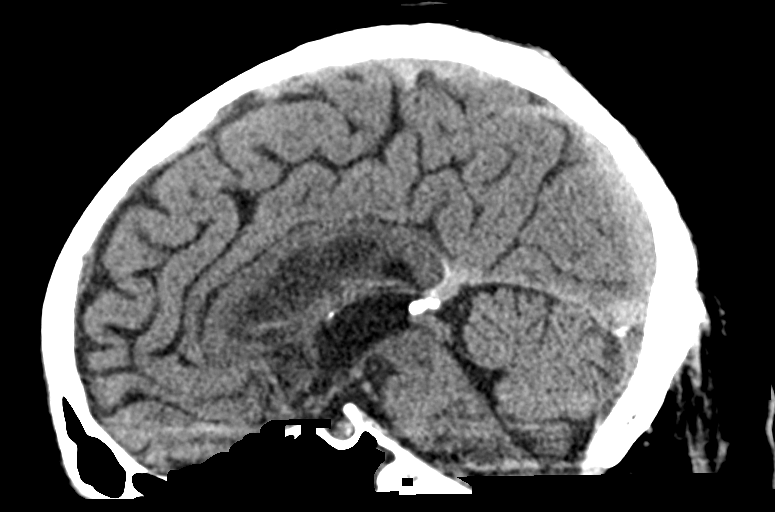
[im 37/55  brain]
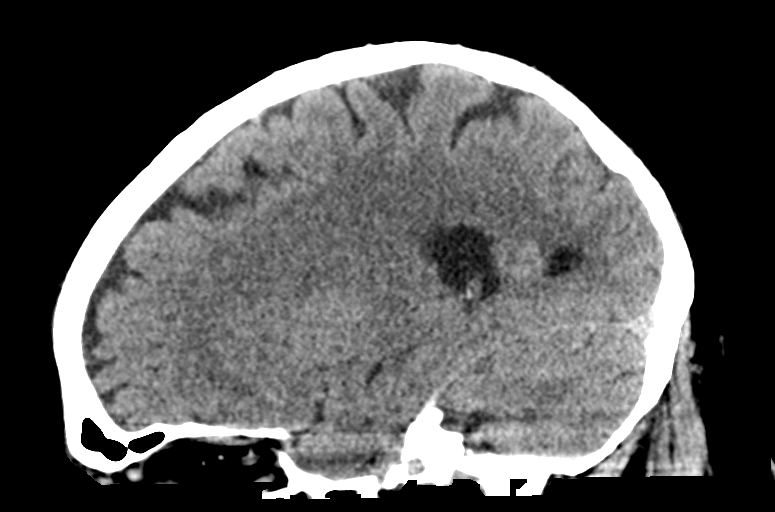

[15 of 47 positions shown; findings below may reference images not displayed]

FINDINGS: Brain: There is no mass, hemorrhage or extra-axial collection. The
size and configuration of the ventricles and extra-axial CSF spaces
are normal. The brain parenchyma is normal, without evidence of
acute or chronic infarction.

Vascular: No abnormal hyperdensity of the major intracranial
arteries or dural venous sinuses. No intracranial atherosclerosis.

Skull: The visualized skull base, calvarium and extracranial soft
tissues are normal.

Sinuses/Orbits: No fluid levels or advanced mucosal thickening of
the visualized paranasal sinuses. No mastoid or middle ear effusion.
The orbits are normal.
IMPRESSION: Normal head CT.

## 2019-08-09 ENCOUNTER — Other Ambulatory Visit: Payer: Self-pay

## 2019-08-09 ENCOUNTER — Ambulatory Visit (INDEPENDENT_AMBULATORY_CARE_PROVIDER_SITE_OTHER): Payer: 59

## 2019-08-09 DIAGNOSIS — L570 Actinic keratosis: Secondary | ICD-10-CM

## 2019-08-09 MED ORDER — AMINOLEVULINIC ACID HCL 20 % EX SOLR
2.0000 "application " | Freq: Once | CUTANEOUS | Status: AC
  Administered 2019-08-09: 708 mg via TOPICAL

## 2019-08-09 NOTE — Progress Notes (Signed)
Patient completed PDT therapy today.  AK (actinic keratosis) (4) Left Forearm - Anterior; Right Forearm - Anterior; Left Dorsal Hand; Right Dorsal Hand  Photodynamic therapy - Left Dorsal Hand, Left Forearm - Anterior, Right Dorsal Hand, Right Forearm - Anterior Procedure discussed: discussed risks, benefits, side effects. and alternatives   Prep: site scrubbed/prepped with acetone   Number of lesions:  Multiple Type of treatment:  Blue light Aminolevulinic Acid (see MAR for details): Levulan Number of minutes under lamp:  16 Number of seconds under lamp:  40 Cooling:  Floor fan Outcome: patient tolerated procedure well with no complications   Post-procedure details: sunscreen applied    Aminolevulinic Acid HCl 20 % SOLR 708 mg - Left Dorsal Hand, Left Forearm - Anterior, Right Dorsal Hand, Right Forearm - Anterior

## 2019-08-09 NOTE — Patient Instructions (Signed)

## 2019-09-25 ENCOUNTER — Ambulatory Visit (INDEPENDENT_AMBULATORY_CARE_PROVIDER_SITE_OTHER): Payer: 59 | Admitting: Dermatology

## 2019-09-25 ENCOUNTER — Other Ambulatory Visit: Payer: Self-pay

## 2019-09-25 ENCOUNTER — Encounter: Payer: Self-pay | Admitting: Dermatology

## 2019-09-25 DIAGNOSIS — D1801 Hemangioma of skin and subcutaneous tissue: Secondary | ICD-10-CM

## 2019-09-25 DIAGNOSIS — Z85820 Personal history of malignant melanoma of skin: Secondary | ICD-10-CM

## 2019-09-25 DIAGNOSIS — L814 Other melanin hyperpigmentation: Secondary | ICD-10-CM

## 2019-09-25 DIAGNOSIS — Z1283 Encounter for screening for malignant neoplasm of skin: Secondary | ICD-10-CM

## 2019-09-25 DIAGNOSIS — L820 Inflamed seborrheic keratosis: Secondary | ICD-10-CM | POA: Diagnosis not present

## 2019-09-25 DIAGNOSIS — L821 Other seborrheic keratosis: Secondary | ICD-10-CM

## 2019-09-25 DIAGNOSIS — D0362 Melanoma in situ of left upper limb, including shoulder: Secondary | ICD-10-CM

## 2019-09-25 DIAGNOSIS — D485 Neoplasm of uncertain behavior of skin: Secondary | ICD-10-CM

## 2019-09-25 DIAGNOSIS — L570 Actinic keratosis: Secondary | ICD-10-CM | POA: Diagnosis not present

## 2019-09-25 DIAGNOSIS — L578 Other skin changes due to chronic exposure to nonionizing radiation: Secondary | ICD-10-CM | POA: Diagnosis not present

## 2019-09-25 DIAGNOSIS — D229 Melanocytic nevi, unspecified: Secondary | ICD-10-CM

## 2019-09-25 NOTE — Patient Instructions (Signed)

## 2019-09-25 NOTE — Progress Notes (Signed)
Follow-Up Visit   Subjective  Chad Terry is a 64 y.o. male who presents for the following: Annual Exam (recheck AK's - PDT of the face on 07/26/19 and the B/L arms on 08/09/19). Patient presents for total-body skin exam for skin cancer screening and mole check.  The following portions of the chart were reviewed this encounter and updated as appropriate:  Tobacco  Allergies  Meds  Problems  Med Hx  Surg Hx  Fam Hx     Review of Systems:  No other skin or systemic complaints except as noted in HPI or Assessment and Plan.  Objective  Well appearing patient in no apparent distress; mood and affect are within normal limits.  A full examination was performed including scalp, head, eyes, ears, nose, lips, neck, chest, axillae, abdomen, back, buttocks, bilateral upper extremities, bilateral lower extremities, hands, feet, fingers, toes, fingernails, and toenails. All findings within normal limits unless otherwise noted below.  Objective  Scalp, face, ears x 18 (18): Erythematous thin papules/macules with gritty scale.   Objective  B/L arms/hands x 21 (21): Erythematous keratotic or waxy stuck-on papule or plaque.   Objective  L ant deltoid: 1.5 x 1.0 cm irregular brown macule       Assessment & Plan  AK (actinic keratosis) (18) Scalp, face, ears x 18  S/P PDT on the arms and face - discussed topical chemotherapy in the future  Destruction of lesion - Scalp, face, ears x 18 Complexity: simple   Destruction method: cryotherapy   Informed consent: discussed and consent obtained   Timeout:  patient name, date of birth, surgical site, and procedure verified Lesion destroyed using liquid nitrogen: Yes   Region frozen until ice ball extended beyond lesion: Yes   Outcome: patient tolerated procedure well with no complications   Post-procedure details: wound care instructions given    Inflamed seborrheic keratosis (21) B/L arms/hands x 21  Destruction of lesion - B/L  arms/hands x 21 Complexity: simple   Destruction method: cryotherapy   Informed consent: discussed and consent obtained   Timeout:  patient name, date of birth, surgical site, and procedure verified Lesion destroyed using liquid nitrogen: Yes   Region frozen until ice ball extended beyond lesion: Yes   Outcome: patient tolerated procedure well with no complications   Post-procedure details: wound care instructions given    Neoplasm of uncertain behavior of skin L ant deltoid  Skin / nail biopsy Type of biopsy: tangential   Informed consent: discussed and consent obtained   Timeout: patient name, date of birth, surgical site, and procedure verified   Procedure prep:  Patient was prepped and draped in usual sterile fashion Prep type:  Isopropyl alcohol Anesthesia: the lesion was anesthetized in a standard fashion   Anesthetic:  1% lidocaine w/ epinephrine 1-100,000 buffered w/ 8.4% NaHCO3 Instrument used: flexible razor blade   Hemostasis achieved with: pressure, aluminum chloride and electrodesiccation   Outcome: patient tolerated procedure well   Post-procedure details: sterile dressing applied and wound care instructions given   Dressing type: bandage and petrolatum    Specimen 1 - Surgical pathology Differential Diagnosis: D48.5 r/o lentigo maligna vs other Check Margins: No 1.5 x 1.0 cm irregular brown macule   Seborrheic Keratoses - Stuck-on, waxy, tan-brown papules and plaques  - Discussed benign etiology and prognosis. - Observe - Call for any changes  Actinic Damage - diffuse scaly erythematous macules with underlying dyspigmentation - Recommend daily broad spectrum sunscreen SPF 30+ to sun-exposed areas, reapply every  2 hours as needed.  - Call for new or changing lesions.  Lentigines - Scattered tan macules - Discussed due to sun exposure - Benign, observe - Call for any changes  Melanocytic Nevi - Tan-brown and/or pink-flesh-colored symmetric macules and  papules - Benign appearing on exam today - Observation - Call clinic for new or changing moles - Recommend daily use of broad spectrum spf 30+ sunscreen to sun-exposed areas.   Hemangiomas - Red papules - Discussed benign nature - Observe - Call for any changes  History of Melanoma - No evidence of recurrence today - No lymphadenopathy - Recommend regular full body skin exams - Recommend daily broad spectrum sunscreen SPF 30+ to sun-exposed areas, reapply every 2 hours as needed.  - Call if any new or changing lesions are noted between office visits   Return in about 6 months (around 03/27/2020) for sun exposed areas.  Luther Redo, CMA, am acting as scribe for Sarina Ser, MD .  Documentation: I have reviewed the above documentation for accuracy and completeness, and I agree with the above.  Sarina Ser, MD

## 2019-09-27 ENCOUNTER — Telehealth: Payer: Self-pay

## 2019-09-27 NOTE — Telephone Encounter (Signed)
Patient informed of pathology results and surgery appointment scheduled. 

## 2019-10-10 ENCOUNTER — Ambulatory Visit (INDEPENDENT_AMBULATORY_CARE_PROVIDER_SITE_OTHER): Payer: 59 | Admitting: Dermatology

## 2019-10-10 ENCOUNTER — Encounter: Payer: Self-pay | Admitting: Dermatology

## 2019-10-10 ENCOUNTER — Other Ambulatory Visit: Payer: Self-pay

## 2019-10-10 ENCOUNTER — Telehealth: Payer: Self-pay

## 2019-10-10 DIAGNOSIS — D0362 Melanoma in situ of left upper limb, including shoulder: Secondary | ICD-10-CM | POA: Diagnosis not present

## 2019-10-10 DIAGNOSIS — D039 Melanoma in situ, unspecified: Secondary | ICD-10-CM

## 2019-10-10 HISTORY — DX: Melanoma in situ, unspecified: D03.9

## 2019-10-10 MED ORDER — MUPIROCIN 2 % EX OINT: 1.0000 "application " | TOPICAL_OINTMENT | Freq: Every day | CUTANEOUS | 2 refills | Status: DC

## 2019-10-10 NOTE — Telephone Encounter (Signed)
Lft pt message to call if any problems after today's surgery./sh 

## 2019-10-10 NOTE — Progress Notes (Signed)
   Follow-Up Visit   Subjective  Chad Terry is a 64 y.o. 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   The following portions of the chart were reviewed this encounter and updated as appropriate:  Tobacco  Allergies  Meds  Problems  Med Hx  Surg Hx  Fam Hx      Review of Systems:  No other skin or systemic complaints except as noted in HPI or Assessment and Plan.  Objective  Well appearing patient in no apparent distress; mood and affect are within normal limits.  A focused examination was performed including L anterior shoulder. Relevant physical exam findings are noted in the Assessment and Plan.  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  Skin excision  Lesion length (cm):  1.5 Lesion width (cm):  1.5 Margin per side (cm):  0.5 Total excision diameter (cm):  2.5 Informed consent: discussed and consent obtained   Timeout: patient name, date of birth, surgical site, and procedure verified   Procedure prep:  Patient was prepped and draped in usual sterile fashion Prep type:  Isopropyl alcohol and povidone-iodine Anesthesia: the lesion was anesthetized in a standard fashion   Anesthetic:  1% lidocaine w/ epinephrine 1-100,000 buffered w/ 8.4% NaHCO3 (13cc) Instrument used: #15 blade   Hemostasis achieved with: pressure   Hemostasis achieved with comment:  Electrocautery Outcome: patient tolerated procedure well with no complications   Post-procedure details: sterile dressing applied and wound care instructions given   Dressing type: bandage and pressure dressing (Mupirocin)   Additional details:  Wound left for 2ndary intention healing  Tagged at 12:00 superior edge with suture  mupirocin ointment (BACTROBAN) 2 %  Specimen 1 - Surgical pathology Differential Diagnosis:  D03.62 Lentigo Maligna Check Margins: yes Pink bx site 1.5cm Previous bx [id number] Tagged 12:00 superior edge with suture  Lentigo Maligna  Return in about 1 week (around 10/17/2019) for surgery f/u.  I, [name], RMA, am acting as scribe for Sarina Ser, MD .  Documentation: I have reviewed the above documentation for accuracy and completeness, and I agree with the above.  Sarina Ser, MD

## 2019-10-10 NOTE — Patient Instructions (Signed)

## 2019-10-11 ENCOUNTER — Encounter: Payer: Self-pay | Admitting: Dermatology

## 2019-10-17 ENCOUNTER — Ambulatory Visit (INDEPENDENT_AMBULATORY_CARE_PROVIDER_SITE_OTHER): Payer: 59 | Admitting: Dermatology

## 2019-10-17 ENCOUNTER — Other Ambulatory Visit: Payer: Self-pay

## 2019-10-17 DIAGNOSIS — Z86006 Personal history of melanoma in-situ: Secondary | ICD-10-CM

## 2019-10-17 DIAGNOSIS — Z85820 Personal history of malignant melanoma of skin: Secondary | ICD-10-CM

## 2019-10-17 NOTE — Patient Instructions (Signed)

## 2019-10-17 NOTE — Progress Notes (Signed)
   Follow-Up Visit   Subjective  Laron Burge is a 64 y.o. male who presents for the following: post op (recheck MMIS site of the L deltoid - wound healing via secondary intention).  The following portions of the chart were reviewed this encounter and updated as appropriate:  Tobacco  Allergies  Meds  Problems  Med Hx  Surg Hx  Fam Hx     Review of Systems:  No other skin or systemic complaints except as noted in HPI or Assessment and Plan.  Objective  Well appearing patient in no apparent distress; mood and affect are within normal limits.  A focused examination was performed including the trunk. Relevant physical exam findings are noted in the Assessment and Plan.  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 Then a thin coat of Mupirocin 2% ointment was applied to affected area, covered with a non-stick gauze, and retention tape. Continue wound care daily follow by Mupirocin 2% ointment and cover with a non-stick gauze/tape. Recheck at follow up appointment  Return in about 1 month (around 11/17/2019).   Luther Redo, CMA, am acting as scribe for Sarina Ser, MD . Documentation: I have reviewed the above documentation for accuracy and completeness, and I agree with the above.  Sarina Ser, MD

## 2019-10-18 ENCOUNTER — Encounter: Payer: Self-pay | Admitting: Dermatology

## 2019-11-16 ENCOUNTER — Other Ambulatory Visit: Payer: Self-pay

## 2019-11-16 ENCOUNTER — Ambulatory Visit (INDEPENDENT_AMBULATORY_CARE_PROVIDER_SITE_OTHER): Payer: 59 | Admitting: Dermatology

## 2019-11-16 DIAGNOSIS — Z86006 Personal history of melanoma in-situ: Secondary | ICD-10-CM

## 2019-11-16 NOTE — Progress Notes (Signed)
   Follow-Up Visit   Subjective  Chad Terry is a 64 y.o. 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  The following portions of the chart were reviewed this encounter and updated as appropriate:  Tobacco  Allergies  Meds  Problems  Med Hx  Surg Hx  Fam Hx      Review of Systems:  No other skin or systemic complaints except as noted in HPI or Assessment and Plan.  Objective  Well appearing patient in no apparent distress; mood and affect are within normal limits.  A focused examination was performed including L ant deltoid. Relevant physical exam findings are noted in the Assessment and Plan.  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   I, [name], RMA, am acting as scribe for Sarina Ser, MD .  Documentation: I have reviewed the above documentation for accuracy and completeness, and I agree with the above.  Sarina Ser, MD

## 2019-11-22 ENCOUNTER — Encounter: Payer: Self-pay | Admitting: Dermatology

## 2020-02-26 ENCOUNTER — Other Ambulatory Visit
Admission: RE | Admit: 2020-02-26 | Discharge: 2020-02-26 | Disposition: A | Discharge status: Home / Self Care | Payer: 59 | Source: Ambulatory Visit | Attending: Internal Medicine | Admitting: Internal Medicine

## 2020-02-26 ENCOUNTER — Ambulatory Visit: Payer: 59 | Admitting: Dermatology

## 2020-02-26 ENCOUNTER — Other Ambulatory Visit: Payer: Self-pay

## 2020-02-26 DIAGNOSIS — Z01812 Encounter for preprocedural laboratory examination: Secondary | ICD-10-CM | POA: Diagnosis present

## 2020-02-26 DIAGNOSIS — Z20822 Contact with and (suspected) exposure to covid-19: Secondary | ICD-10-CM | POA: Insufficient documentation

## 2020-02-26 LAB — SARS CORONAVIRUS 2 (TAT 6-24 HRS): SARS Coronavirus 2: NEGATIVE

## 2020-02-27 ENCOUNTER — Encounter: Payer: Self-pay | Admitting: Internal Medicine

## 2020-02-28 ENCOUNTER — Ambulatory Visit: Payer: 59 | Admitting: Anesthesiology

## 2020-02-28 ENCOUNTER — Other Ambulatory Visit: Payer: Self-pay

## 2020-02-28 ENCOUNTER — Encounter
Admission: RE | Disposition: A | Discharge status: Home / Self Care | Payer: Self-pay | Source: Home / Self Care | Attending: Internal Medicine

## 2020-02-28 ENCOUNTER — Encounter: Payer: Self-pay | Admitting: Internal Medicine

## 2020-02-28 ENCOUNTER — Ambulatory Visit
Admission: RE | Admit: 2020-02-28 | Discharge: 2020-02-28 | Disposition: A | Discharge status: Home / Self Care | Payer: 59 | Attending: Internal Medicine | Admitting: Internal Medicine

## 2020-02-28 DIAGNOSIS — K640 First degree hemorrhoids: Secondary | ICD-10-CM | POA: Insufficient documentation

## 2020-02-28 DIAGNOSIS — F329 Major depressive disorder, single episode, unspecified: Secondary | ICD-10-CM | POA: Diagnosis not present

## 2020-02-28 DIAGNOSIS — F17200 Nicotine dependence, unspecified, uncomplicated: Secondary | ICD-10-CM | POA: Diagnosis not present

## 2020-02-28 DIAGNOSIS — I10 Essential (primary) hypertension: Secondary | ICD-10-CM | POA: Diagnosis not present

## 2020-02-28 DIAGNOSIS — Z791 Long term (current) use of non-steroidal anti-inflammatories (NSAID): Secondary | ICD-10-CM | POA: Insufficient documentation

## 2020-02-28 DIAGNOSIS — Z85820 Personal history of malignant melanoma of skin: Secondary | ICD-10-CM | POA: Insufficient documentation

## 2020-02-28 DIAGNOSIS — R194 Change in bowel habit: Secondary | ICD-10-CM | POA: Diagnosis present

## 2020-02-28 DIAGNOSIS — D123 Benign neoplasm of transverse colon: Secondary | ICD-10-CM | POA: Insufficient documentation

## 2020-02-28 DIAGNOSIS — Z79899 Other long term (current) drug therapy: Secondary | ICD-10-CM | POA: Diagnosis not present

## 2020-02-28 DIAGNOSIS — Z85828 Personal history of other malignant neoplasm of skin: Secondary | ICD-10-CM | POA: Diagnosis not present

## 2020-02-28 DIAGNOSIS — F419 Anxiety disorder, unspecified: Secondary | ICD-10-CM | POA: Diagnosis not present

## 2020-02-28 DIAGNOSIS — Z955 Presence of coronary angioplasty implant and graft: Secondary | ICD-10-CM | POA: Insufficient documentation

## 2020-02-28 DIAGNOSIS — I252 Old myocardial infarction: Secondary | ICD-10-CM | POA: Diagnosis not present

## 2020-02-28 DIAGNOSIS — Z7902 Long term (current) use of antithrombotics/antiplatelets: Secondary | ICD-10-CM | POA: Diagnosis not present

## 2020-02-28 DIAGNOSIS — E7800 Pure hypercholesterolemia, unspecified: Secondary | ICD-10-CM | POA: Diagnosis not present

## 2020-02-28 DIAGNOSIS — Z7982 Long term (current) use of aspirin: Secondary | ICD-10-CM | POA: Insufficient documentation

## 2020-02-28 HISTORY — PX: COLONOSCOPY WITH PROPOFOL: SHX5780

## 2020-02-28 SURGERY — COLONOSCOPY WITH PROPOFOL
Anesthesia: General

## 2020-02-28 MED ORDER — LIDOCAINE HCL (CARDIAC) PF 100 MG/5ML IV SOSY
PREFILLED_SYRINGE | INTRAVENOUS | Status: DC | PRN
  Administered 2020-02-28: 100 mg via INTRAVENOUS

## 2020-02-28 MED ORDER — PROPOFOL 500 MG/50ML IV EMUL
INTRAVENOUS | Status: DC | PRN
  Administered 2020-02-28: 175 ug/kg/min via INTRAVENOUS

## 2020-02-28 MED ORDER — MIDAZOLAM HCL 2 MG/2ML IJ SOLN
INTRAMUSCULAR | Status: AC
  Filled 2020-02-28: qty 2

## 2020-02-28 MED ORDER — SODIUM CHLORIDE 0.9 % IV SOLN: INTRAVENOUS | Status: DC

## 2020-02-28 MED ORDER — PROPOFOL 10 MG/ML IV BOLUS
INTRAVENOUS | Status: DC | PRN
  Administered 2020-02-28: 60 mg via INTRAVENOUS
  Administered 2020-02-28: 10 mg via INTRAVENOUS

## 2020-02-28 MED ORDER — MIDAZOLAM HCL 2 MG/2ML IJ SOLN
INTRAMUSCULAR | Status: DC | PRN
  Administered 2020-02-28: 2 mg via INTRAVENOUS

## 2020-02-28 NOTE — H&P (Signed)
Outpatient short stay form Pre-procedure 02/28/2020 2:55 PM Joclynn Lumb K. Alice Reichert, M.D.  Primary Physician: Fulton Reek, M.D.  Reason for visit: Change in bowel habits  History of present illness:  Patient with loose stools, improved with empiric gluten free diet. Patient denies rectal bleeding, weight loss or abdominal pain.      Current Facility-Administered Medications:  .  0.9 %  sodium chloride infusion, , Intravenous, Continuous, Khalessi Blough, Benay Pike, MD  Medications Prior to Admission  Medication Sig Dispense Refill Last Dose  . ALPRAZolam (XANAX XR) 2 MG 24 hr tablet Take 2 mg by mouth 3 (three) times daily. Takes at bedtime one pill, other only if needed   02/28/2020 at 1200  . aspirin 81 MG tablet Take 81 mg by mouth daily.   02/27/2020 at Unknown time  . atorvastatin (LIPITOR) 40 MG tablet Take by mouth.   02/28/2020 at 0830  . metoprolol succinate (TOPROL-XL) 50 MG 24 hr tablet Take 50 mg by mouth daily. Take with or immediately following a meal.   02/28/2020 at 0830  . Multiple Vitamin (MULTIVITAMIN) capsule Take 1 capsule by mouth daily.   Past Month at Unknown time  . Omega-3 Fatty Acids (OMEGA-3 FISH OIL PO) Take 360-1,200 mg by mouth every morning. Omega 3-fish oil 360-1200mg  daily not in database   Past Month at Unknown time  . Potassium Gluconate 550 (90 K) MG TABS Take 550 mg by mouth every morning. Written as 550mg (90mg )?tab   Past Month at Unknown time  . sertraline (ZOLOFT) 100 MG tablet Take 150 mg by mouth at bedtime.   02/28/2020 at Unknown time  . simvastatin (ZOCOR) 40 MG tablet Take 40 mg by mouth daily.   02/27/2020 at Unknown time  . clopidogrel (PLAVIX) 75 MG tablet Take 1 tablet (75 mg total) by mouth daily with breakfast. 30 tablet 6 02/23/2020  . dimenhyDRINATE (DRAMAMINE) 50 MG tablet Take 50 mg by mouth daily at 10 pm. 4 tablet at bedtime     . mupirocin ointment (BACTROBAN) 2 % Apply 1 application topically daily. Qd to wound until healed 22 g 2   . naproxen  (NAPROSYN) 500 MG tablet Take 1 tablet (500 mg total) by mouth 2 (two) times daily with a meal. 20 tablet 00   . triamterene-hydrochlorothiazide (DYAZIDE) 37.5-25 MG capsule Take 1 capsule by mouth daily. (Patient not taking: Reported on 02/28/2020)   Not Taking at Unknown time     No Known Allergies   Past Medical History:  Diagnosis Date  . Actinic keratosis   . Anginal pain ([place])   . Anxiety   . Atypical mole 09/17/2009   left anterior deltoid  . Atypical mole 03/06/2015   left low back  . Atypical mole 10/20/2017   left mid tricep/mod, left mid back lateral at side/mod  . Atypical mole 01/19/2018   left side lat abdomen/mod  . Basal cell carcinoma 11/02/2007   right forehead  . Basal cell carcinoma 12/19/2007   mid forehead, sup to glabella  . Basal cell carcinoma 10/03/2012   right medial canthus inferior, right forehead  . Basal cell carcinoma 03/09/2016   left anterior deltoid  . Cancer ([place])    melanoma  . Colon polyp   . Depression   . Hemorrhoids   . Hypercholesteremia   . Hypertension   . Melanoma ([place]) 05/30/2008   left post shoulder/MMis/WLE  . Melanoma in situ ([place]) 10/10/2019   L ant deltoid  . Squamous cell carcinoma of skin 09/17/2009  right dorsum hand  . Squamous cell carcinoma of skin 10/03/2012   left medial canthus nasal root  . Tobacco abuse     Review of systems:  Otherwise negative.    Physical Exam  Gen: Alert, oriented. Appears stated age.  HEENT: [place]/AT. PERRLA. Lungs: CTA, no wheezes. CV: RR nl S1, S2. Abd: soft, benign, no masses. BS+ Ext: No edema. Pulses 2+    Planned procedures: Proceed with colonoscopy. The patient understands the nature of the planned procedure, indications, risks, alternatives and potential complications including but not limited to bleeding, infection, perforation, damage to internal organs and possible oversedation/side effects from anesthesia. The patient agrees and gives consent to proceed.  Please  refer to procedure notes for findings, recommendations and patient disposition/instructions.     Philopateer Strine K. Alice Reichert, M.D. Gastroenterology 02/28/2020  2:55 PM

## 2020-02-28 NOTE — Op Note (Signed)
[facility] Patient Name: Chad Terry Procedure Date: 02/28/2020 2:49 PM MRN: [id number] Account #: [id number] Date of Birth: 28-Nov-1955 Admit Type: Outpatient Age: 64 Room: [facility] ENDO ROOM 4 Gender: Male Note Status: Finalized Procedure:             Colonoscopy Indications:           Change in bowel habits Providers:             Benay Pike. Alice Reichert MD, MD Referring MD:          Leonie Douglas. Doy Hutching, MD (Referring MD) Medicines:             Propofol per Anesthesia Complications:         No immediate complications. Procedure:             Pre-Anesthesia Assessment:                        - The risks and benefits of the procedure and the                         sedation options and risks were discussed with the                         patient. All questions were answered and informed                         consent was obtained.                        - Patient identification and proposed procedure were                         verified prior to the procedure by the nurse. The                         procedure was verified in the procedure room.                        - ASA Grade Assessment: III - A patient with severe                         systemic disease.                        - After reviewing the risks and benefits, the patient                         was deemed in satisfactory condition to undergo the                         procedure.                        After obtaining informed consent, the colonoscope was                         passed under direct vision. Throughout the procedure,                         the patient's blood pressure, pulse,  and oxygen                         saturations were monitored continuously. The                         Colonoscope was introduced through the anus and                         advanced to the the cecum, identified by appendiceal                         orifice and ileocecal valve. The colonoscopy  was                         performed without difficulty. The patient tolerated                         the procedure well. The quality of the bowel                         preparation was adequate. The ileocecal valve,                         appendiceal orifice, and rectum were photographed. Findings:      The perianal and digital rectal examinations were normal. Pertinent       negatives include normal sphincter tone and no palpable rectal lesions.      A 4 mm polyp was found in the transverse colon. The polyp was sessile.       The polyp was removed with a cold biopsy forceps. Resection and       retrieval were complete.      The exam was otherwise without abnormality.      Biopsies for histology were taken with a cold forceps from the random       colon for evaluation of microscopic colitis.      Non-bleeding internal hemorrhoids were found during retroflexion. The       hemorrhoids were Grade I (internal hemorrhoids that do not prolapse).      The terminal ileum appeared normal. Impression:            - One 4 mm polyp in the transverse colon, removed with                         a cold biopsy forceps. Resected and retrieved.                        - The examination was otherwise normal.                        - Non-bleeding internal hemorrhoids.                        - The examined portion of the ileum was normal.                        - Biopsies were taken with a cold forceps from the  random colon for evaluation of microscopic colitis. Recommendation:        - Patient has a contact number available for                         emergencies. The signs and symptoms of potential                         delayed complications were discussed with the patient.                         Return to normal activities tomorrow. Written                         discharge instructions were provided to the patient.                        - Resume previous diet.                         - Continue present medications.                        - Repeat colonoscopy is recommended for surveillance.                         The colonoscopy date will be determined after                         pathology results from today's exam become available                         for review.                        - Return to GI office in 3 months.                        - The findings and recommendations were discussed with                         the patient. Procedure Code(s):     --- Professional ---                        [id number], Colonoscopy, flexible; with biopsy, single or                         multiple Diagnosis Code(s):     --- Professional ---                        R19.4, Change in bowel habit                        K64.0, First degree hemorrhoids                        K63.5, Polyp of colon CPT copyright 2019 American Medical Association. All rights reserved. The codes documented in this report are preliminary and upon coder review may  be revised to meet current compliance requirements. Efrain Sella MD, MD 02/28/2020 3:27:50 PM  This report has been signed electronically. Number of Addenda: 0 Note Initiated On: 02/28/2020 2:49 PM Scope Withdrawal Time: 0 hours 9 minutes 15 seconds  Total Procedure Duration: 0 hours 13 minutes 7 seconds  Estimated Blood Loss:  Estimated blood loss: none. Estimated blood loss:                         none. Estimated blood loss: none.      [facility]

## 2020-02-28 NOTE — Anesthesia Procedure Notes (Signed)
Procedure Name: General with mask airway °Performed by: Fletcher-Harrison, Caedon Bond, CRNA °Pre-anesthesia Checklist: Patient identified, Emergency Drugs available, Suction available and Patient being monitored °Oxygen Delivery Method: Simple face mask °Induction Type: IV induction °Placement Confirmation: positive ETCO2 and CO2 detector °Dental Injury: Teeth and Oropharynx as per pre-operative assessment  ° ° ° ° ° ° °

## 2020-02-28 NOTE — Interval H&P Note (Signed)
History and Physical Interval Note:  02/28/2020 2:57 PM  Chad Terry  has presented today for surgery, with the diagnosis of change in bowel habits.  The various methods of treatment have been discussed with the patient and family. After consideration of risks, benefits and other options for treatment, the patient has consented to  Procedure(s): COLONOSCOPY WITH PROPOFOL (N/A) as a surgical intervention.  The patient's history has been reviewed, patient examined, no change in status, stable for surgery.  I have reviewed the patient's chart and labs.  Questions were answered to the patient's satisfaction.     [place], [place]

## 2020-02-28 NOTE — Anesthesia Preprocedure Evaluation (Signed)
Anesthesia Evaluation  Patient identified by MRN, date of birth, ID band Patient awake    Reviewed: Allergy & Precautions, NPO status , Patient's Chart, lab work & pertinent test results  History of Anesthesia Complications Negative for: history of anesthetic complications  Airway Mallampati: II  TM Distance: >3 FB Neck ROM: Full    Dental no notable dental hx.    Pulmonary neg sleep apnea, neg COPD, Current Smoker and Patient abstained from smoking.,    breath sounds clear to auscultation- rhonchi (-) wheezing      Cardiovascular hypertension, + Cardiac Stents (2016)  (-) CAD and (-) Past MI  Rhythm:Regular Rate:Normal - Systolic murmurs and - Diastolic murmurs    Neuro/Psych neg Seizures PSYCHIATRIC DISORDERS Anxiety Depression negative neurological ROS     GI/Hepatic negative GI ROS, Neg liver ROS,   Endo/Other  negative endocrine ROSneg diabetes  Renal/GU negative Renal ROS     Musculoskeletal negative musculoskeletal ROS (+)   Abdominal (+) + obese,   Peds  Hematology negative hematology ROS (+)   Anesthesia Other Findings Past Medical History: No date: Actinic keratosis No date: Anginal pain ([place]) No date: Anxiety 09/17/2009: Atypical mole     Comment:  left anterior deltoid 03/06/2015: Atypical mole     Comment:  left low back 10/20/2017: Atypical mole     Comment:  left mid tricep/mod, left mid back lateral at side/mod 01/19/2018: Atypical mole     Comment:  left side lat abdomen/mod 11/02/2007: Basal cell carcinoma     Comment:  right forehead 12/19/2007: Basal cell carcinoma     Comment:  mid forehead, sup to glabella 10/03/2012: Basal cell carcinoma     Comment:  right medial canthus inferior, right forehead 03/09/2016: Basal cell carcinoma     Comment:  left anterior deltoid No date: Cancer [facility])     Comment:  melanoma No date: Colon polyp No date: Depression No date: Hemorrhoids No  date: Hypercholesteremia No date: Hypertension 05/30/2008: Melanoma ([place])     Comment:  left post shoulder/MMis/WLE 10/10/2019: Melanoma in situ ([place])     Comment:  L ant deltoid 09/17/2009: Squamous cell carcinoma of skin     Comment:  right dorsum hand 10/03/2012: Squamous cell carcinoma of skin     Comment:  left medial canthus nasal root No date: Tobacco abuse   Reproductive/Obstetrics                             Anesthesia Physical Anesthesia Plan  ASA: III  Anesthesia Plan: General   Post-op Pain Management:    Induction: Intravenous  PONV Risk Score and Plan: 0 and Propofol infusion  Airway Management Planned: Natural Airway  Additional Equipment:   Intra-op Plan:   Post-operative Plan:   Informed Consent: I have reviewed the patients History and Physical, chart, labs and discussed the procedure including the risks, benefits and alternatives for the proposed anesthesia with the patient or authorized representative who has indicated his/her understanding and acceptance.     Dental advisory given  Plan Discussed with: CRNA and Anesthesiologist  Anesthesia Plan Comments:         Anesthesia Quick Evaluation

## 2020-02-28 NOTE — Transfer of Care (Signed)
Immediate Anesthesia Transfer of Care Note  Patient: Chad Terry  Procedure(s) Performed: COLONOSCOPY WITH PROPOFOL (N/A )  Patient Location: Endoscopy Unit  Anesthesia Type:General  Level of Consciousness: drowsy and responds to stimulation  Airway & Oxygen Therapy: Patient Spontanous Breathing and Patient connected to face mask oxygen  Post-op Assessment: Report given to RN and Post -op Vital signs reviewed and stable  Post vital signs: Reviewed and stable  Last Vitals:  Vitals Value Taken Time  BP 119/66 02/28/20 1524  Temp    Pulse 62 02/28/20 1524  Resp 16 02/28/20 1524  SpO2 100 % 02/28/20 1524    Last Pain:  Vitals:   02/28/20 1300  PainSc: 0-No pain         Complications: No complications documented.

## 2020-02-29 ENCOUNTER — Encounter: Payer: Self-pay | Admitting: Internal Medicine

## 2020-02-29 NOTE — Anesthesia Postprocedure Evaluation (Signed)
Anesthesia Post Note  Patient: Izaih Kataoka  Procedure(s) Performed: COLONOSCOPY WITH PROPOFOL (N/A )  Patient location during evaluation: PACU Anesthesia Type: General Level of consciousness: awake and alert Pain management: pain level controlled Vital Signs Assessment: post-procedure vital signs reviewed and stable Respiratory status: spontaneous breathing, nonlabored ventilation and respiratory function stable Cardiovascular status: blood pressure returned to baseline and stable Postop Assessment: no apparent nausea or vomiting Anesthetic complications: no   No complications documented.   Last Vitals:  Vitals:   02/28/20 1544 02/28/20 1554  BP: (!) 138/93 (!) 164/97  Pulse: 65 65  Resp: 17   Temp:    SpO2: 98%     Last Pain:  Vitals:   02/28/20 1554  TempSrc:   PainSc: 0-No pain                 Brett Canales Patton Rabinovich

## 2020-03-01 LAB — SURGICAL PATHOLOGY

## 2020-04-04 ENCOUNTER — Encounter: Payer: 59 | Admitting: Dermatology

## 2020-04-08 ENCOUNTER — Ambulatory Visit (INDEPENDENT_AMBULATORY_CARE_PROVIDER_SITE_OTHER): Payer: 59 | Admitting: Dermatology

## 2020-04-08 ENCOUNTER — Other Ambulatory Visit: Payer: Self-pay

## 2020-04-08 DIAGNOSIS — L814 Other melanin hyperpigmentation: Secondary | ICD-10-CM

## 2020-04-08 DIAGNOSIS — Z86018 Personal history of other benign neoplasm: Secondary | ICD-10-CM

## 2020-04-08 DIAGNOSIS — Z1283 Encounter for screening for malignant neoplasm of skin: Secondary | ICD-10-CM | POA: Diagnosis not present

## 2020-04-08 DIAGNOSIS — D229 Melanocytic nevi, unspecified: Secondary | ICD-10-CM

## 2020-04-08 DIAGNOSIS — L578 Other skin changes due to chronic exposure to nonionizing radiation: Secondary | ICD-10-CM | POA: Diagnosis not present

## 2020-04-08 DIAGNOSIS — L821 Other seborrheic keratosis: Secondary | ICD-10-CM

## 2020-04-08 DIAGNOSIS — L570 Actinic keratosis: Secondary | ICD-10-CM

## 2020-04-08 DIAGNOSIS — Z86007 Personal history of in-situ neoplasm of skin: Secondary | ICD-10-CM

## 2020-04-08 DIAGNOSIS — L820 Inflamed seborrheic keratosis: Secondary | ICD-10-CM | POA: Diagnosis not present

## 2020-04-08 DIAGNOSIS — Z85828 Personal history of other malignant neoplasm of skin: Secondary | ICD-10-CM

## 2020-04-08 DIAGNOSIS — D1800 Hemangioma unspecified site: Secondary | ICD-10-CM

## 2020-04-08 NOTE — Patient Instructions (Addendum)

## 2020-04-08 NOTE — Progress Notes (Signed)
Follow-Up Visit   Subjective  Chad Terry is a 64 y.o. male who presents for the following: TBSE. Patient here for full body skin exam and skin cancer screening. Patient has a history of MMis, SCC, BCC, Dysplastic Nevi and AK's. Nothing new or changing today that patient is aware of.  The patient presents for Total-Body Skin Exam (TBSE) for skin cancer screening and mole check.  The following portions of the chart were reviewed this encounter and updated as appropriate:  Tobacco  Allergies  Meds  Problems  Med Hx  Surg Hx  Fam Hx     Review of Systems:  No other skin or systemic complaints except as noted in HPI or Assessment and Plan.  Objective  Well appearing patient in no apparent distress; mood and affect are within normal limits.  A full examination was performed including scalp, head, eyes, ears, nose, lips, neck, chest, axillae, abdomen, back, buttocks, bilateral upper extremities, bilateral lower extremities, hands, feet, fingers, toes, fingernails, and toenails. All findings within normal limits unless otherwise noted below.  Objective  trunk x 6, hands x 12 (18): Erythematous keratotic or waxy stuck-on papule or plaque.   Objective  Scalp and face x 16 (16): Erythematous thin papules/macules with gritty scale.    Assessment & Plan  Inflamed seborrheic keratosis (18) trunk x 6, hands x 12  Destruction of lesion - trunk x 6, hands x 12 Complexity: simple   Destruction method: cryotherapy   Informed consent: discussed and consent obtained   Timeout:  patient name, date of birth, surgical site, and procedure verified Lesion destroyed using liquid nitrogen: Yes   Region frozen until ice ball extended beyond lesion: Yes   Outcome: patient tolerated procedure well with no complications   Post-procedure details: wound care instructions given    AK (actinic keratosis) (16) Scalp and face x 16  Destruction of lesion - Scalp and face x 16 Complexity: simple    Destruction method: cryotherapy   Informed consent: discussed and consent obtained   Timeout:  patient name, date of birth, surgical site, and procedure verified Lesion destroyed using liquid nitrogen: Yes   Region frozen until ice ball extended beyond lesion: Yes   Outcome: patient tolerated procedure well with no complications   Post-procedure details: wound care instructions given     Severe actinic changes with precancerous actinic keratoses.  Discussed "Field Treatment" for Severe, Confluent Actinic Changes with Pre-Cancerous Actinic Keratoses due to cumulative sun exposure/UV radiation exposure over time Field treatment involves treatment of an entire area of skin that has confluent Actinic Changes (Sun/ Ultraviolet light damage) and PreCancerous Actinic Keratoses by method of PhotoDynamic Therapy (PDT) and/or prescription Topical Chemotherapy agents such as 5-fluorouracil, 5-fluorouracil/calcipotriene, and/or imiquimod.  The purpose is to decrease the number of clinically evident and subclinical PreCancerous lesions to prevent progression to development of skin cancer by chemically destroying early precancer changes that may or may not be visible.  It has been shown to reduce the risk of developing skin cancer in the treated area. As a result of treatment, redness, scaling, crusting, and open sores may occur during treatment course. One or more than one of these methods may be used and may have to be used several times to control, suppress and eliminate the PreCancerous changes. Discussed treatment course, expected reaction, and possible side effects. Schedule for PDT of the scalp.  Lentigines - Scattered tan macules - Discussed due to sun exposure - Benign, observe - Call for any changes  Seborrheic Keratoses - Stuck-on, waxy, tan-brown papules and plaques  - Discussed benign etiology and prognosis. - Observe - Call for any changes  Melanocytic Nevi - Tan-brown and/or  pink-flesh-colored symmetric macules and papules - Benign appearing on exam today - Observation - Call clinic for new or changing moles - Recommend daily use of broad spectrum spf 30+ sunscreen to sun-exposed areas.   Hemangiomas - Red papules - Discussed benign nature - Observe - Call for any changes  Actinic Damage - Chronic, secondary to cumulative UV/sun exposure - diffuse scaly erythematous macules with underlying dyspigmentation - Recommend daily broad spectrum sunscreen SPF 30+ to sun-exposed areas, reapply every 2 hours as needed.  - Call for new or changing lesions.  Skin cancer screening performed today.  History of Dysplastic Nevi - No evidence of recurrence today - Recommend regular full body skin exams - Recommend daily broad spectrum sunscreen SPF 30+ to sun-exposed areas, reapply every 2 hours as needed.  - Call if any new or changing lesions are noted between office visits  History of Basal Cell Carcinoma of the Skin - No evidence of recurrence today - Recommend regular full body skin exams - Recommend daily broad spectrum sunscreen SPF 30+ to sun-exposed areas, reapply every 2 hours as needed.  - Call if any new or changing lesions are noted between office visits  History of Squamous Cell Carcinoma of the Skin - No evidence of recurrence today - No lymphadenopathy - Recommend regular full body skin exams - Recommend daily broad spectrum sunscreen SPF 30+ to sun-exposed areas, reapply every 2 hours as needed.  - Call if any new or changing lesions are noted between office visits  History of Melanoma in Situ - No evidence of recurrence today - No lymphadenopathy - Recommend regular full body skin exams - Recommend daily broad spectrum sunscreen SPF 30+ to sun-exposed areas, reapply every 2 hours as needed.  - Call if any new or changing lesions are noted between office visits  Return in about 3 months (around 07/09/2020) for TBSE, PDT scalp in  January.  Graciella Belton, RMA, am acting as scribe for Sarina Ser, MD . Documentation: I have reviewed the above documentation for accuracy and completeness, and I agree with the above.  Sarina Ser, MD

## 2020-04-09 ENCOUNTER — Encounter: Payer: Self-pay | Admitting: Dermatology

## 2020-06-04 ENCOUNTER — Other Ambulatory Visit: Payer: Self-pay

## 2020-06-04 ENCOUNTER — Ambulatory Visit (INDEPENDENT_AMBULATORY_CARE_PROVIDER_SITE_OTHER): Payer: Medicare Other

## 2020-06-04 DIAGNOSIS — L570 Actinic keratosis: Secondary | ICD-10-CM | POA: Diagnosis not present

## 2020-06-04 MED ORDER — AMINOLEVULINIC ACID HCL 20 % EX SOLR
1.0000 "application " | Freq: Once | CUTANEOUS | Status: AC
  Administered 2020-06-04: 354 mg via TOPICAL

## 2020-06-04 NOTE — Progress Notes (Signed)
1. AK (actinic keratosis) °Scalp ° °Photodynamic therapy - Scalp °Procedure discussed: discussed risks, benefits, side effects. and alternatives   °Prep: site scrubbed/prepped with acetone   °Location:  Scalp °Number of lesions:  Multiple °Type of treatment:  Blue light °Aminolevulinic Acid (see MAR for details): Levulan °Number of Levulan sticks used:  1 °Incubation time (minutes):  120 °Number of minutes under lamp:  16 °Number of seconds under lamp:  40 °Cooling:  Floor fan °Outcome: patient tolerated procedure well with no complications   °Post-procedure details: sunscreen applied and aftercare instructions given to patient   ° °Aminolevulinic Acid HCl 20 % SOLR 354 mg - Scalp ° ° °  °

## 2020-06-04 NOTE — Patient Instructions (Signed)

## 2020-07-16 ENCOUNTER — Ambulatory Visit (INDEPENDENT_AMBULATORY_CARE_PROVIDER_SITE_OTHER): Payer: Medicare Other | Admitting: Dermatology

## 2020-07-16 ENCOUNTER — Encounter: Payer: Self-pay | Admitting: Dermatology

## 2020-07-16 ENCOUNTER — Other Ambulatory Visit: Payer: Self-pay

## 2020-07-16 DIAGNOSIS — L820 Inflamed seborrheic keratosis: Secondary | ICD-10-CM

## 2020-07-16 DIAGNOSIS — Z85828 Personal history of other malignant neoplasm of skin: Secondary | ICD-10-CM | POA: Diagnosis not present

## 2020-07-16 DIAGNOSIS — L570 Actinic keratosis: Secondary | ICD-10-CM | POA: Diagnosis not present

## 2020-07-16 DIAGNOSIS — L814 Other melanin hyperpigmentation: Secondary | ICD-10-CM

## 2020-07-16 DIAGNOSIS — L918 Other hypertrophic disorders of the skin: Secondary | ICD-10-CM

## 2020-07-16 DIAGNOSIS — Z1283 Encounter for screening for malignant neoplasm of skin: Secondary | ICD-10-CM

## 2020-07-16 DIAGNOSIS — D1800 Hemangioma unspecified site: Secondary | ICD-10-CM

## 2020-07-16 DIAGNOSIS — Z86006 Personal history of melanoma in-situ: Secondary | ICD-10-CM

## 2020-07-16 DIAGNOSIS — L821 Other seborrheic keratosis: Secondary | ICD-10-CM

## 2020-07-16 DIAGNOSIS — L578 Other skin changes due to chronic exposure to nonionizing radiation: Secondary | ICD-10-CM

## 2020-07-16 DIAGNOSIS — Z86018 Personal history of other benign neoplasm: Secondary | ICD-10-CM

## 2020-07-16 DIAGNOSIS — D229 Melanocytic nevi, unspecified: Secondary | ICD-10-CM

## 2020-07-16 NOTE — Patient Instructions (Signed)

## 2020-07-16 NOTE — Progress Notes (Signed)
Follow-Up Visit   Subjective  Chad Terry is a 65 y.o. male who presents for the following: Total body skin exam (Hx of Melanoma IS L post shoulder, L ant deltoid, hx of BCCs, Hx of SCCs, hx of Dysplatic Nevi, hx of AKs). The patient presents for Total-Body Skin Exam (TBSE) for skin cancer screening and mole check.  The following portions of the chart were reviewed this encounter and updated as appropriate:   Tobacco  Allergies  Meds  Problems  Med Hx  Surg Hx  Fam Hx     Review of Systems:  No other skin or systemic complaints except as noted in HPI or Assessment and Plan.  Objective  Well appearing patient in no apparent distress; mood and affect are within normal limits.  A full examination was performed including scalp, head, eyes, ears, nose, lips, neck, chest, axillae, abdomen, back, buttocks, bilateral upper extremities, bilateral lower extremities, hands, feet, fingers, toes, fingernails, and toenails. All findings within normal limits unless otherwise noted below.  Objective  L ant deltoid, L post shoulder: Well healed scar with no evidence of recurrence, no lymphadenopathy.   Objective  L nose x 1, L ant shouder x 1 (2): Pink scaly macules   Objective  Left Forearm x 1: Erythematous keratotic or waxy stuck-on papule or plaque.    Assessment & Plan    Lentigines - Scattered tan macules - Discussed due to sun exposure - Benign, observe - Call for any changes  Seborrheic Keratoses - Stuck-on, waxy, tan-brown papules and plaques  - Discussed benign etiology and prognosis. - Observe - Call for any changes  Melanocytic Nevi - Tan-brown and/or pink-flesh-colored symmetric macules and papules - Benign appearing on exam today - Observation - Call clinic for new or changing moles - Recommend daily use of broad spectrum spf 30+ sunscreen to sun-exposed areas.   Hemangiomas - Red papules - Discussed benign nature - Observe - Call for any  changes  Actinic Damage - Chronic, secondary to cumulative UV/sun exposure - diffuse scaly erythematous macules with underlying dyspigmentation - Recommend daily broad spectrum sunscreen SPF 30+ to sun-exposed areas, reapply every 2 hours as needed.  - Call for new or changing lesions.  Skin cancer screening performed today.  History of Dysplastic Nevi - No evidence of recurrence today - Recommend regular full body skin exams - Recommend daily broad spectrum sunscreen SPF 30+ to sun-exposed areas, reapply every 2 hours as needed.  - Call if any new or changing lesions are noted between office visits  History of Basal Cell Carcinoma of the Skin - No evidence of recurrence today - Recommend regular full body skin exams - Recommend daily broad spectrum sunscreen SPF 30+ to sun-exposed areas, reapply every 2 hours as needed.  - Call if any new or changing lesions are noted between office visits  History of Squamous Cell Carcinoma of the Skin - No evidence of recurrence today - No lymphadenopathy - Recommend regular full body skin exams - Recommend daily broad spectrum sunscreen SPF 30+ to sun-exposed areas, reapply every 2 hours as needed.  - Call if any new or changing lesions are noted between office visits  Acrochordons (Skin Tags) - Fleshy, skin-colored pedunculated papules - Benign appearing.  - Observe. - If desired, they can be removed with an in office procedure that is not covered by insurance. - Please call the clinic if you notice any new or changing lesions.  History of melanoma in situ L ant deltoid, L  post shoulder L ant deltoid 10/10/2019 L post shoulder 05/30/2008  Clear.  No lymphadenopathy.  Observe for recurrence. Call clinic for new or changing lesions.  Recommend regular skin exams, daily broad-spectrum spf 30+ sunscreen use, and photoprotection.     AK (actinic keratosis) (2) L nose x 1, L ant shouder x 1  Destruction of lesion - L nose x 1, L ant  shouder x 1 Complexity: simple   Destruction method: cryotherapy   Informed consent: discussed and consent obtained   Timeout:  patient name, date of birth, surgical site, and procedure verified Lesion destroyed using liquid nitrogen: Yes   Region frozen until ice ball extended beyond lesion: Yes   Outcome: patient tolerated procedure well with no complications   Post-procedure details: wound care instructions given    Inflamed seborrheic keratosis Left Forearm x 1  Destruction of lesion - Left Forearm x 1 Complexity: simple   Destruction method: cryotherapy   Informed consent: discussed and consent obtained   Timeout:  patient name, date of birth, surgical site, and procedure verified Lesion destroyed using liquid nitrogen: Yes   Region frozen until ice ball extended beyond lesion: Yes   Outcome: patient tolerated procedure well with no complications   Post-procedure details: wound care instructions given    Return in about 3 months (around 10/13/2020) for TBSE, Hx of Melanoma, Hx of BCC, Hx of SCC, Hx of Dysplastic nevi, Hx of AKs.   I, Chad Terry, RMA, am acting as scribe for Sarina Ser, MD .  Documentation: I have reviewed the above documentation for accuracy and completeness, and I agree with the above.  Sarina Ser, MD

## 2020-09-23 ENCOUNTER — Other Ambulatory Visit: Payer: Self-pay

## 2020-09-23 ENCOUNTER — Ambulatory Visit (INDEPENDENT_AMBULATORY_CARE_PROVIDER_SITE_OTHER): Payer: Medicare Other | Admitting: Dermatology

## 2020-09-23 DIAGNOSIS — D229 Melanocytic nevi, unspecified: Secondary | ICD-10-CM

## 2020-09-23 DIAGNOSIS — L570 Actinic keratosis: Secondary | ICD-10-CM | POA: Diagnosis not present

## 2020-09-23 DIAGNOSIS — Z85820 Personal history of malignant melanoma of skin: Secondary | ICD-10-CM | POA: Diagnosis not present

## 2020-09-23 DIAGNOSIS — Z86018 Personal history of other benign neoplasm: Secondary | ICD-10-CM

## 2020-09-23 DIAGNOSIS — Z1283 Encounter for screening for malignant neoplasm of skin: Secondary | ICD-10-CM

## 2020-09-23 DIAGNOSIS — L820 Inflamed seborrheic keratosis: Secondary | ICD-10-CM | POA: Diagnosis not present

## 2020-09-23 DIAGNOSIS — B353 Tinea pedis: Secondary | ICD-10-CM

## 2020-09-23 DIAGNOSIS — L821 Other seborrheic keratosis: Secondary | ICD-10-CM

## 2020-09-23 DIAGNOSIS — L814 Other melanin hyperpigmentation: Secondary | ICD-10-CM

## 2020-09-23 DIAGNOSIS — Z85828 Personal history of other malignant neoplasm of skin: Secondary | ICD-10-CM

## 2020-09-23 DIAGNOSIS — L578 Other skin changes due to chronic exposure to nonionizing radiation: Secondary | ICD-10-CM

## 2020-09-23 DIAGNOSIS — D1800 Hemangioma unspecified site: Secondary | ICD-10-CM

## 2020-09-23 NOTE — Progress Notes (Signed)
Follow-Up Visit   Subjective  Chad Terry is a 65 y.o. male who presents for the following: Annual Exam (Hx MM, dysplastic nevi, SCC, BCC ). The patient presents for Total-Body Skin Exam (TBSE) for skin cancer screening and mole check.  The following portions of the chart were reviewed this encounter and updated as appropriate:   Tobacco  Allergies  Meds  Problems  Med Hx  Surg Hx  Fam Hx     Review of Systems:  No other skin or systemic complaints except as noted in HPI or Assessment and Plan.  Objective  Well appearing patient in no apparent distress; mood and affect are within normal limits.  A focused examination was performed including extremities, including the arms, hands, fingers, and fingernails and the legs, feet, toes, and toenails. Relevant physical exam findings are noted in the Assessment and Plan.  Objective  B/L foot: Scaling and maceration web spaces and over distal and lateral soles.   Objective  arms x 10, face x 10 (20): Erythematous thin papules/macules with gritty scale.   Objective  Legs x 20, arms x 10 (30): Erythematous keratotic or waxy stuck-on papule or plaque.   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 - patient declines tx today   AK (actinic keratosis) (20) arms x 10, face x 10 Destruction of lesion - arms x 10, face x 10 Complexity: simple   Destruction method: cryotherapy   Informed consent: discussed and consent obtained   Timeout:  patient name, date of birth, surgical site, and procedure verified Lesion destroyed using liquid nitrogen: Yes   Region frozen until ice ball extended beyond lesion: Yes   Outcome: patient tolerated procedure well with no complications   Post-procedure details: wound care instructions given    Inflamed seborrheic keratosis (30) Legs x 20, arms x 10 Destruction of lesion - Legs x 20, arms x 10 Complexity: simple   Destruction method: cryotherapy   Informed  consent: discussed and consent obtained   Timeout:  patient name, date of birth, surgical site, and procedure verified Lesion destroyed using liquid nitrogen: Yes   Region frozen until ice ball extended beyond lesion: Yes   Outcome: patient tolerated procedure well with no complications   Post-procedure details: wound care instructions given     Lentigines - Scattered tan macules - Due to sun exposure - Benign-appering, observe - Recommend daily broad spectrum sunscreen SPF 30+ to sun-exposed areas, reapply every 2 hours as needed. - Call for any changes  Seborrheic Keratoses - Stuck-on, waxy, tan-brown papules and/or plaques  - Benign-appearing - Discussed benign etiology and prognosis. - Observe - Call for any changes  Melanocytic Nevi - Tan-brown and/or pink-flesh-colored symmetric macules and papules - Benign appearing on exam today - Observation - Call clinic for new or changing moles - Recommend daily use of broad spectrum spf 30+ sunscreen to sun-exposed areas.   Hemangiomas - Red papules - Discussed benign nature - Observe - Call for any changes  Actinic Damage - Chronic condition, secondary to cumulative UV/sun exposure - diffuse scaly erythematous macules with underlying dyspigmentation - Recommend daily broad spectrum sunscreen SPF 30+ to sun-exposed areas, reapply every 2 hours as needed.  - Staying in the shade or wearing long sleeves, sun glasses (UVA+UVB protection) and wide brim hats (4-inch brim around the entire circumference of the hat) are also recommended for sun protection.  - Call for new or changing lesions.  Actinic Damage - Severe,  confluent actinic changes with pre-cancerous actinic keratoses  - Severe, chronic, not at goal, secondary to cumulative UV radiation exposure over time - diffuse scaly erythematous macules and papules with underlying dyspigmentation - Discussed Prescription "Field Treatment" for Severe, Chronic Confluent Actinic Changes  with Pre-Cancerous Actinic Keratoses Field treatment involves treatment of an entire area of skin that has confluent Actinic Changes (Sun/ Ultraviolet light damage) and PreCancerous Actinic Keratoses by method of PhotoDynamic Therapy (PDT) and/or prescription Topical Chemotherapy agents such as 5-fluorouracil, 5-fluorouracil/calcipotriene, and/or imiquimod.  The purpose is to decrease the number of clinically evident and subclinical PreCancerous lesions to prevent progression to development of skin cancer by chemically destroying early precancer changes that may or may not be visible.  It has been shown to reduce the risk of developing skin cancer in the treated area. As a result of treatment, redness, scaling, crusting, and open sores may occur during treatment course. One or more than one of these methods may be used and may have to be used several times to control, suppress and eliminate the PreCancerous changes. Discussed treatment course, expected reaction, and possible side effects. - Recommend daily broad spectrum sunscreen SPF 30+ to sun-exposed areas, reapply every 2 hours as needed.  - Staying in the shade or wearing long sleeves, sun glasses (UVA+UVB protection) and wide brim hats (4-inch brim around the entire circumference of the hat) are also recommended. - Call for new or changing lesions. - In 6 weeks start PDT of the face, and in 8 weeks PDT of the arms and hands  Skin cancer screening performed today.  Return in about 6 months (around 03/26/2021) for TBSE - in 6 weeks start PDT of the face, then in 8 weeks start PDT of the hands and arms.  Luther Redo, CMA, am acting as scribe for Sarina Ser, MD .  Documentation: I have reviewed the above documentation for accuracy and completeness, and I agree with the above.  Sarina Ser, MD

## 2020-09-23 NOTE — Patient Instructions (Signed)

## 2020-09-24 ENCOUNTER — Encounter: Payer: Self-pay | Admitting: Dermatology

## 2020-11-04 ENCOUNTER — Other Ambulatory Visit: Payer: Self-pay

## 2020-11-04 ENCOUNTER — Ambulatory Visit (INDEPENDENT_AMBULATORY_CARE_PROVIDER_SITE_OTHER): Payer: Medicare Other

## 2020-11-04 DIAGNOSIS — L570 Actinic keratosis: Secondary | ICD-10-CM | POA: Diagnosis not present

## 2020-11-04 MED ORDER — AMINOLEVULINIC ACID HCL 20 % EX SOLR
2.0000 "application " | Freq: Once | CUTANEOUS | Status: AC
  Administered 2020-11-04: 708 mg via TOPICAL

## 2020-11-04 NOTE — Patient Instructions (Signed)

## 2020-11-04 NOTE — Progress Notes (Signed)
1. AK (actinic keratosis) (4) Left Hand - Posterior; Right Hand - Posterior; Left Forearm - Posterior; Right Forearm - Posterior  Photodynamic therapy - Left Forearm - Posterior, Left Hand - Posterior, Right Forearm - Posterior, Right Hand - Posterior Procedure discussed: discussed risks, benefits, side effects. and alternatives   Prep: site scrubbed/prepped with acetone   Location:  B/l posterior forearms, b/l dorsum hands Number of lesions:  Multiple Type of treatment:  Blue light Aminolevulinic Acid (see MAR for details): Levulan Number of Levulan sticks used:  2 Incubation time (minutes):  120 Number of minutes under lamp:  16 Number of seconds under lamp:  40 Cooling:  Floor fan Outcome: patient tolerated procedure well with no complications   Post-procedure details: sunscreen applied and aftercare instructions given to patient    Related Medications Aminolevulinic Acid HCl 20 % SOLR 708 mg

## 2020-12-02 ENCOUNTER — Other Ambulatory Visit: Payer: Self-pay

## 2020-12-02 ENCOUNTER — Ambulatory Visit: Payer: Medicare Other

## 2020-12-02 DIAGNOSIS — L570 Actinic keratosis: Secondary | ICD-10-CM

## 2020-12-02 NOTE — Progress Notes (Signed)
   Follow-Up Visit   Subjective  Chad Terry is a 65 y.o. male who presents for the following: Actinic Keratosis (Pt here for PDT on the face today ).    The following portions of the chart were reviewed this encounter and updated as appropriate:        Review of Systems:  No other skin or systemic complaints except as noted in HPI or Assessment and Plan.  Objective  Well appearing patient in no apparent distress; mood and affect are within normal limits.  A focused examination was performed including face. Relevant physical exam findings are noted in the Assessment and Plan.  Head - Anterior (Face) Erythematous thin papules/macules with gritty scale.    Assessment & Plan  AK (actinic keratosis) Head - Anterior (Face)  Photodynamic therapy - Head - Anterior (Face) Procedure discussed: discussed risks, benefits, side effects. and alternatives   Prep: site scrubbed/prepped with acetone   Location:  Face Number of lesions:  Multiple (multiple) Type of treatment:  Blue light Aminolevulinic Acid (see MAR for details): Levulan Number of Levulan sticks used:  1 Number of minutes under lamp:  16 (16) Number of seconds under lamp:  40 (40) Cooling:  Floor fan Outcome: patient tolerated procedure well with no complications   Post-procedure details: sunscreen applied and aftercare instructions given to patient    Return for as scheduled .  I, Marye Round, CMA, am acting as scribe for [employer] .

## 2020-12-02 NOTE — Patient Instructions (Signed)
Recommend daily broad spectrum sunscreen SPF 30+ to sun-exposed areas, reapply every 2 hours as needed. Call for new or changing lesions.  Staying in the shade or wearing long sleeves, sun glasses (UVA+UVB protection) and wide brim hats (4-inch brim around the entire circumference of the hat) are also recommended for sun protection.   If you have any questions or concerns for your doctor, please call our main line at [phone] and press option 4 to reach your doctor's medical assistant. If no one answers, please leave a voicemail as directed and we will return your call as soon as possible. Messages left after 4 pm will be answered the following business day.   You may also send us a message via MyChart. We typically respond to MyChart messages within 1-2 business days.  For prescription refills, please ask your pharmacy to contact our office. Our fax number is [phone].  If you have an urgent issue when the clinic is closed that cannot wait until the next business day, you can page your doctor at the number below.    Please note that while we do our best to be available for urgent issues outside of office hours, we are not available 24/7.   If you have an urgent issue and are unable to reach us, you may choose to seek medical care at your doctor's office, retail clinic, urgent care center, or emergency room.  If you have a medical emergency, please immediately call 911 or go to the emergency department.  Pager Numbers  - Dr. Kowalski: [phone]  - Dr. Moye: [phone]  - Dr. Stewart: [phone]  In the event of inclement weather, please call our main line at [phone] for an update on the status of any delays or closures.  Dermatology Medication Tips: Please keep the boxes that topical medications come in in order to help keep track of the instructions about where and how to use these. Pharmacies typically print the medication instructions only on the boxes and not  directly on the medication tubes.   If your medication is too expensive, please contact our office at [phone] option 4 or send us a message through MyChart.   We are unable to tell what your co-pay for medications will be in advance as this is different depending on your insurance coverage. However, we may be able to find a substitute medication at lower cost or fill out paperwork to get insurance to cover a needed medication.   If a prior authorization is required to get your medication covered by your insurance company, please allow us 1-2 business days to complete this process.  Drug prices often vary depending on where the prescription is filled and some pharmacies may offer cheaper prices.  The website www.goodrx.com contains coupons for medications through different pharmacies. The prices here do not account for what the cost may be with help from insurance (it may be cheaper with your insurance), but the website can give you the price if you did not use any insurance.  - You can print the associated coupon and take it with your prescription to the pharmacy.  - You may also stop by our office during regular business hours and pick up a GoodRx coupon card.  - If you need your prescription sent electronically to a different pharmacy, notify our office through [facility] MyChart or by phone at [phone] option 4.  

## 2021-03-31 ENCOUNTER — Other Ambulatory Visit: Payer: Self-pay

## 2021-03-31 ENCOUNTER — Ambulatory Visit (INDEPENDENT_AMBULATORY_CARE_PROVIDER_SITE_OTHER): Payer: Medicare Other | Admitting: Dermatology

## 2021-03-31 DIAGNOSIS — Z85820 Personal history of malignant melanoma of skin: Secondary | ICD-10-CM | POA: Diagnosis not present

## 2021-03-31 DIAGNOSIS — L814 Other melanin hyperpigmentation: Secondary | ICD-10-CM

## 2021-03-31 DIAGNOSIS — Z86018 Personal history of other benign neoplasm: Secondary | ICD-10-CM | POA: Diagnosis not present

## 2021-03-31 DIAGNOSIS — C44319 Basal cell carcinoma of skin of other parts of face: Secondary | ICD-10-CM

## 2021-03-31 DIAGNOSIS — Z1283 Encounter for screening for malignant neoplasm of skin: Secondary | ICD-10-CM

## 2021-03-31 DIAGNOSIS — D229 Melanocytic nevi, unspecified: Secondary | ICD-10-CM

## 2021-03-31 DIAGNOSIS — D1801 Hemangioma of skin and subcutaneous tissue: Secondary | ICD-10-CM

## 2021-03-31 DIAGNOSIS — L821 Other seborrheic keratosis: Secondary | ICD-10-CM

## 2021-03-31 DIAGNOSIS — L570 Actinic keratosis: Secondary | ICD-10-CM | POA: Diagnosis not present

## 2021-03-31 DIAGNOSIS — L820 Inflamed seborrheic keratosis: Secondary | ICD-10-CM

## 2021-03-31 DIAGNOSIS — Z85828 Personal history of other malignant neoplasm of skin: Secondary | ICD-10-CM | POA: Diagnosis not present

## 2021-03-31 DIAGNOSIS — L578 Other skin changes due to chronic exposure to nonionizing radiation: Secondary | ICD-10-CM

## 2021-03-31 DIAGNOSIS — D485 Neoplasm of uncertain behavior of skin: Secondary | ICD-10-CM

## 2021-03-31 NOTE — Progress Notes (Signed)
Follow-Up Visit   Subjective  Chad Terry is a 65 y.o. male who presents for the following: Annual Exam (History of Melanoma/SCC/BCC/Dysplastic Nevi - TBSE today). The patient presents for Total-Body Skin Exam (TBSE) for skin cancer screening and mole check.  The following portions of the chart were reviewed this encounter and updated as appropriate:   Tobacco  Allergies  Meds  Problems  Med Hx  Surg Hx  Fam Hx     Review of Systems:  No other skin or systemic complaints except as noted in HPI or Assessment and Plan.  Objective  Well appearing patient in no apparent distress; mood and affect are within normal limits.  A full examination was performed including scalp, head, eyes, ears, nose, lips, neck, chest, axillae, abdomen, back, buttocks, bilateral upper extremities, bilateral lower extremities, hands, feet, fingers, toes, fingernails, and toenails. All findings within normal limits unless otherwise noted below.  Left cheek Pearly papule 0.8 cm         Scalp x 2 (2) Erythematous thin papules/macules with gritty scale.   Mid Back x 2 Erythematous keratotic or waxy stuck-on papule or plaque.    Assessment & Plan   Lentigines - Scattered tan macules - Due to sun exposure - Benign-appearing, observe - Recommend daily broad spectrum sunscreen SPF 30+ to sun-exposed areas, reapply every 2 hours as needed. - Call for any changes  Seborrheic Keratoses - Stuck-on, waxy, tan-brown papules and/or plaques  - Benign-appearing - Discussed benign etiology and prognosis. - Observe - Call for any changes  Melanocytic Nevi - Tan-brown and/or pink-flesh-colored symmetric macules and papules - Benign appearing on exam today - Observation - Call clinic for new or changing moles - Recommend daily use of broad spectrum spf 30+ sunscreen to sun-exposed areas.   Hemangiomas - Red papules - Discussed benign nature - Observe - Call for any changes  Actinic  Damage - Chronic condition, secondary to cumulative UV/sun exposure - diffuse scaly erythematous macules with underlying dyspigmentation - Recommend daily broad spectrum sunscreen SPF 30+ to sun-exposed areas, reapply every 2 hours as needed.  - Staying in the shade or wearing long sleeves, sun glasses (UVA+UVB protection) and wide brim hats (4-inch brim around the entire circumference of the hat) are also recommended for sun protection.  - Call for new or changing lesions.  Skin cancer screening performed today.  Neoplasm of uncertain behavior of skin Left cheek  Skin / nail biopsy Type of biopsy: tangential   Informed consent: discussed and consent obtained   Timeout: patient name, date of birth, surgical site, and procedure verified   Procedure prep:  Patient was prepped and draped in usual sterile fashion Prep type:  Isopropyl alcohol Anesthesia: the lesion was anesthetized in a standard fashion   Anesthetic:  1% lidocaine w/ epinephrine 1-100,000 buffered w/ 8.4% NaHCO3 Instrument used: flexible razor blade   Hemostasis achieved with: pressure, aluminum chloride and electrodesiccation   Outcome: patient tolerated procedure well   Post-procedure details: sterile dressing applied and wound care instructions given   Dressing type: bandage and petrolatum    Specimen 1 - Surgical pathology Differential Diagnosis: BCC vs other  Check Margins: No  Will plan MOHs if +BCC  AK (actinic keratosis) (2) Scalp x 2  Destruction of lesion - Scalp x 2 Complexity: simple   Destruction method: cryotherapy   Informed consent: discussed and consent obtained   Timeout:  patient name, date of birth, surgical site, and procedure verified Lesion destroyed using liquid nitrogen:  Yes   Region frozen until ice ball extended beyond lesion: Yes   Outcome: patient tolerated procedure well with no complications   Post-procedure details: wound care instructions given    Inflamed seborrheic  keratosis Mid Back x 2  Destruction of lesion - Mid Back x 2 Complexity: simple   Destruction method: cryotherapy   Informed consent: discussed and consent obtained   Timeout:  patient name, date of birth, surgical site, and procedure verified Lesion destroyed using liquid nitrogen: Yes   Region frozen until ice ball extended beyond lesion: Yes   Outcome: patient tolerated procedure well with no complications   Post-procedure details: wound care instructions given    Skin cancer screening  History of Melanoma x 2 - No evidence of recurrence today - No lymphadenopathy - Recommend regular full body skin exams - Recommend daily broad spectrum sunscreen SPF 30+ to sun-exposed areas, reapply every 2 hours as needed.  - Call if any new or changing lesions are noted between office visits  Return in about 4 months (around 07/29/2021) for TBSE.  I, Ashok Cordia, CMA, am acting as scribe for Sarina Ser, MD . Documentation: I have reviewed the above documentation for accuracy and completeness, and I agree with the above.  Sarina Ser, MD

## 2021-03-31 NOTE — Patient Instructions (Signed)
Cryotherapy Aftercare  Wash gently with soap and water everyday.   Apply Vaseline and Band-Aid daily until healed.    Wound Care Instructions  Cleanse wound gently with soap and water once a day then pat dry with clean gauze. Apply a thing coat of Petrolatum (petroleum jelly, "Vaseline") over the wound (unless you have an allergy to this). We recommend that you use a new, sterile tube of Vaseline. Do not pick or remove scabs. Do not remove the yellow or white "healing tissue" from the base of the wound.  Cover the wound with fresh, clean, nonstick gauze and secure with paper tape. You may use Band-Aids in place of gauze and tape if the would is small enough, but would recommend trimming much of the tape off as there is often too much. Sometimes Band-Aids can irritate the skin.  You should call the office for your biopsy report after 1 week if you have not already been contacted.  If you experience any problems, such as abnormal amounts of bleeding, swelling, significant bruising, significant pain, or evidence of infection, please call the office immediately.  FOR ADULT SURGERY PATIENTS: If you need something for pain relief you may take 1 extra strength Tylenol (acetaminophen) AND 2 Ibuprofen (200mg each) together every 4 hours as needed for pain. (do not take these if you are allergic to them or if you have a reason you should not take them.) Typically, you may only need pain medication for 1 to 3 days.     If you have any questions or concerns for your doctor, please call our main line at [phone] and press option 4 to reach your doctor's medical assistant. If no one answers, please leave a voicemail as directed and we will return your call as soon as possible. Messages left after 4 pm will be answered the following business day.   You may also send us a message via MyChart. We typically respond to MyChart messages within 1-2 business days.  For prescription refills, please ask your  pharmacy to contact our office. Our fax number is [phone].  If you have an urgent issue when the clinic is closed that cannot wait until the next business day, you can page your doctor at the number below.    Please note that while we do our best to be available for urgent issues outside of office hours, we are not available 24/7.   If you have an urgent issue and are unable to reach us, you may choose to seek medical care at your doctor's office, retail clinic, urgent care center, or emergency room.  If you have a medical emergency, please immediately call 911 or go to the emergency department.  Pager Numbers  - Dr. Kowalski: [phone]  - Dr. Moye: [phone]  - Dr. Stewart: [phone]  In the event of inclement weather, please call our main line at [phone] for an update on the status of any delays or closures.  Dermatology Medication Tips: Please keep the boxes that topical medications come in in order to help keep track of the instructions about where and how to use these. Pharmacies typically print the medication instructions only on the boxes and not directly on the medication tubes.   If your medication is too expensive, please contact our office at [phone] option 4 or send us a message through MyChart.   We are unable to tell what your co-pay for medications will be in advance as this is different depending on your insurance coverage.   However, we may be able to find a substitute medication at lower cost or fill out paperwork to get insurance to cover a needed medication.   If a prior authorization is required to get your medication covered by your insurance company, please allow us 1-2 business days to complete this process.  Drug prices often vary depending on where the prescription is filled and some pharmacies may offer cheaper prices.  The website www.goodrx.com contains coupons for medications through different pharmacies. The prices here do not  account for what the cost may be with help from insurance (it may be cheaper with your insurance), but the website can give you the price if you did not use any insurance.  - You can print the associated coupon and take it with your prescription to the pharmacy.  - You may also stop by our office during regular business hours and pick up a GoodRx coupon card.  - If you need your prescription sent electronically to a different pharmacy, notify our office through [facility] MyChart or by phone at [phone] option 4.  

## 2021-04-01 ENCOUNTER — Encounter: Payer: Self-pay | Admitting: Dermatology

## 2021-04-04 ENCOUNTER — Telehealth: Payer: Self-pay

## 2021-04-04 DIAGNOSIS — C44319 Basal cell carcinoma of skin of other parts of face: Secondary | ICD-10-CM

## 2021-04-04 NOTE — Telephone Encounter (Signed)
-----   Message from Ralene Bathe, MD sent at 04/01/2021  6:40 PM EST ----- Diagnosis Skin , left cheek BASAL CELL CARCINOMA, NODULAR AND INFILTRATIVE PATTERNS, BASE INVOLVED  Cancer - BCC with infiltrative pattern Schedule for [facility] / [facility] - Dr Lacinda Axon

## 2021-04-04 NOTE — Telephone Encounter (Signed)
Left message on voicemail to return my call.  

## 2021-04-07 ENCOUNTER — Telehealth: Payer: Self-pay

## 2021-04-07 NOTE — Telephone Encounter (Signed)
Discussed biopsy results with pt, we will refer to Dr Lacinda Axon at [facility] already scheduled March 30 at 2023 with Dr Lacinda Axon

## 2021-05-01 ENCOUNTER — Telehealth: Payer: Self-pay

## 2021-05-01 NOTE — Telephone Encounter (Signed)
Patient came in today regarding previous BX results. He was upset that he had not heard from our office and he did not get a return call on Monday 04/28/21. Advised patient we did try to call him Monday but no answer and it would not let us leave a VM. Patient was made aware of biopsy results on 04/07/21 by Ardelle Park and scheduled with Dr. Lacinda Axon for March 30th. Patient made aware of conversations again today.   Patient went on to question me about his May 2022 visit and why the cryotherapy happened 50 times. I did advise patient that I did not have the credentials or knowledge as I was not in the exam room to answer his questions.   Patient has heard "rumors" regarding Dr. Alveria Apley health and has stated this to be the blame of his medical practice.   Patient kept me for 45 minutes talking about IQs, certifications and testing that allows him to be this smart.

## 2021-07-17 ENCOUNTER — Telehealth: Payer: Self-pay

## 2021-07-17 NOTE — Telephone Encounter (Signed)
Spoke with patient again today regarding his March 20th appointment. Moved into April after [facility], patient agreed.    Patient again dicussed in great detail his last office visit having 50 spots frozen. He does not agree with treatment and believes he is being taking advantage due to his insurance and money.   Patient wants this documented today and from when we discussed in person December.

## 2021-08-11 ENCOUNTER — Ambulatory Visit: Payer: Medicare Other | Admitting: Dermatology

## 2021-08-28 ENCOUNTER — Telehealth: Payer: Self-pay

## 2021-08-28 NOTE — Telephone Encounter (Signed)
Updated specimen tracking and history from MOH[facility] progress notes. aw 

## 2021-09-15 ENCOUNTER — Ambulatory Visit: Payer: Medicare Other | Admitting: Dermatology

## 2021-11-22 DEATH — deceased
